# Patient Record
Sex: Male | Born: 1980 | Race: White | Hispanic: No | Marital: Married | Smoking: Never smoker
Health system: Southern US, Community
[De-identification: ages and names within clinical notes are randomized; demographics above are authoritative.]

## PROBLEM LIST (undated history)

## (undated) DIAGNOSIS — Z8619 Personal history of other infectious and parasitic diseases: Secondary | ICD-10-CM

## (undated) DIAGNOSIS — R7989 Other specified abnormal findings of blood chemistry: Secondary | ICD-10-CM

## (undated) HISTORY — DX: Personal history of other infectious and parasitic diseases: Z86.19

## (undated) HISTORY — PX: WISDOM TOOTH EXTRACTION: SHX21

---

## 2011-08-29 ENCOUNTER — Encounter: Payer: Self-pay | Admitting: *Deleted

## 2011-08-29 ENCOUNTER — Emergency Department (HOSPITAL_BASED_OUTPATIENT_CLINIC_OR_DEPARTMENT_OTHER)
Admission: EM | Admit: 2011-08-29 | Discharge: 2011-08-29 | Disposition: A | Discharge status: Home / Self Care | Payer: 59 | Attending: Emergency Medicine | Admitting: Emergency Medicine

## 2011-08-29 DIAGNOSIS — S91009A Unspecified open wound, unspecified ankle, initial encounter: Secondary | ICD-10-CM | POA: Insufficient documentation

## 2011-08-29 DIAGNOSIS — W268XXA Contact with other sharp object(s), not elsewhere classified, initial encounter: Secondary | ICD-10-CM | POA: Insufficient documentation

## 2011-08-29 DIAGNOSIS — S81009A Unspecified open wound, unspecified knee, initial encounter: Secondary | ICD-10-CM | POA: Insufficient documentation

## 2011-08-29 DIAGNOSIS — Y9289 Other specified places as the place of occurrence of the external cause: Secondary | ICD-10-CM | POA: Insufficient documentation

## 2011-08-29 DIAGNOSIS — S81819A Laceration without foreign body, unspecified lower leg, initial encounter: Secondary | ICD-10-CM

## 2011-08-29 HISTORY — DX: Other specified abnormal findings of blood chemistry: R79.89

## 2011-08-29 MED ORDER — TETANUS-DIPHTH-ACELL PERTUSSIS 5-2.5-18.5 LF-MCG/0.5 IM SUSP
0.5000 mL | Freq: Once | INTRAMUSCULAR | Status: AC
  Administered 2011-08-29: 0.5 mL via INTRAMUSCULAR
  Filled 2011-08-29: qty 0.5

## 2011-08-29 MED ORDER — LIDOCAINE HCL 2 % IJ SOLN
INTRAMUSCULAR | Status: AC
  Administered 2011-08-29: 20 mg
  Filled 2011-08-29: qty 1

## 2011-08-29 NOTE — ED Notes (Signed)
Pt being sutured

## 2011-08-29 NOTE — ED Provider Notes (Signed)
History     CSN: [id number] Arrival date & time: 08/29/2011  6:38 PM   First MD Initiated Contact with Patient 08/29/11 1934      Chief Complaint  Patient presents with  . Laceration    (Consider location/radiation/quality/duration/timing/severity/associated sxs/prior treatment) Patient is a 30 y.o. male presenting with skin laceration. The history is provided by the patient. No language interpreter was used.  Laceration  The incident occurred 1 to 2 hours ago. The laceration is located on the right leg. The laceration is 2 cm in size. The laceration mechanism was a a metal edge. The pain is at a severity of 3/10. The pain is mild. The pain has been constant since onset. He reports no foreign bodies present. His tetanus status is out of date.  Pt cut leg on work out equipment  Past Medical History  Diagnosis Date  . Elevated platelet count     History reviewed. No pertinent past surgical history.  History reviewed. No pertinent family history.  History  Substance Use Topics  . Smoking status: Never Smoker   . Smokeless tobacco: Not on file  . Alcohol Use: Yes      Review of Systems  All other systems reviewed and are negative.    Allergies  Review of patient's allergies indicates no known allergies.  Home Medications  No current outpatient prescriptions on file.  BP 150/98  Pulse 81  Temp(Src) 97.9 F (36.6 C) (Oral)  Resp 18  Ht 6\' 7"  (2.007 m)  Wt 252 lb (114.306 kg)  BMI 28.39 kg/m2  SpO2 100%  Physical Exam  Vitals reviewed. Constitutional: He is oriented to person, place, and time. He appears well-developed and well-nourished.  HENT:  Head: Normocephalic.  Musculoskeletal:       2cm laceration right shin gapping  Neurological: He is alert and oriented to person, place, and time.  Skin: Skin is warm.  Psychiatric: He has a normal mood and affect.    ED Course  LACERATION REPAIR Date/Time: 08/29/2011 8:15 PM Performed by: Langston Masker Authorized by: Langston Masker Consent: Verbal consent not obtained. Consent given by: patient Patient understanding: patient states understanding of the procedure being performed Patient identity confirmed: verbally with patient Time out: Immediately prior to procedure a "time out" was called to verify the correct patient, procedure, equipment, support staff and site/side marked as required. Body area: lower extremity Location details: right lower leg Laceration length: 2 cm Foreign bodies: no foreign bodies Tendon involvement: none Nerve involvement: none Vascular damage: no Anesthesia: local infiltration Local anesthetic: lidocaine 2% without epinephrine Irrigation solution: saline Irrigation method: syringe Amount of cleaning: standard Debridement: none Degree of undermining: none Skin closure: 4-0 Prolene Number of sutures: 6 Technique: simple Approximation: loose Patient tolerance: Patient tolerated the procedure well with no immediate complications.   (including critical care time)  Labs Reviewed - No data to display No results found.   No diagnosis found.    MDM          Langston Masker, [place] 08/29/11 2017

## 2011-08-29 NOTE — ED Provider Notes (Signed)
Evaluation and management procedures were performed by the PA/NP under my supervision/collaboration.    [employer] D Kitai Purdom, MD 08/29/11 [id number] 

## 2011-08-29 NOTE — ED Notes (Signed)
Pt states he was working out in the gym and hit his right shin on some equipment. Approx 3-4 cm lac to area. Dressing applied and bleeding controlled.

## 2015-08-11 ENCOUNTER — Telehealth: Payer: Self-pay

## 2015-08-11 NOTE — Telephone Encounter (Signed)
Called patient to verify/update chart information prior to appointment tomorrow. Left message for call back.

## 2015-08-12 ENCOUNTER — Encounter: Payer: Self-pay | Admitting: Physician Assistant

## 2015-08-12 ENCOUNTER — Ambulatory Visit (INDEPENDENT_AMBULATORY_CARE_PROVIDER_SITE_OTHER): Payer: 59 | Admitting: Physician Assistant

## 2015-08-12 VITALS — BP 138/82 | HR 68 | Temp 97.8°F | Resp 16 | Ht 78.0 in | Wt 253.4 lb

## 2015-08-12 DIAGNOSIS — B351 Tinea unguium: Secondary | ICD-10-CM | POA: Diagnosis not present

## 2015-08-12 DIAGNOSIS — Z0000 Encounter for general adult medical examination without abnormal findings: Secondary | ICD-10-CM | POA: Diagnosis not present

## 2015-08-12 DIAGNOSIS — D473 Essential (hemorrhagic) thrombocythemia: Secondary | ICD-10-CM

## 2015-08-12 DIAGNOSIS — D75839 Thrombocytosis, unspecified: Secondary | ICD-10-CM | POA: Insufficient documentation

## 2015-08-12 NOTE — Assessment & Plan Note (Signed)
Followed by Hematology. Will obtain CBC.

## 2015-08-12 NOTE — Assessment & Plan Note (Signed)
Will obtain LFTS. If normal will begin oral terbinafine.

## 2015-08-12 NOTE — Patient Instructions (Signed)
Please go to the lab for blood work.  I will call you with your results. If your blood work is normal we will follow-up yearly for physicals.    As long as liver testing looks good we will start oral terbinafine for the nail infection.  Please schedule an appointment for your fasting labs.  Preventive Care for Adults, Male A healthy lifestyle and preventive care can promote health and wellness. Preventive health guidelines for men include the following key practices:  A routine yearly physical is a good way to check with your health care provider about your health and preventative screening. It is a chance to share any concerns and updates on your health and to receive a thorough exam.  Visit your dentist for a routine exam and preventative care every 6 months. Brush your teeth twice a day and floss once a day. Good oral hygiene prevents tooth decay and gum disease.  The frequency of eye exams is based on your age, health, family medical history, use of contact lenses, and other factors. Follow your health care provider's recommendations for frequency of eye exams.  Eat a healthy diet. Foods such as vegetables, fruits, whole grains, low-fat dairy products, and lean protein foods contain the nutrients you need without too many calories. Decrease your intake of foods high in solid fats, added sugars, and salt. Eat the right amount of calories for you.Get information about a proper diet from your health care provider, if necessary.  Regular physical exercise is one of the most important things you can do for your health. Most adults should get at least 150 minutes of moderate-intensity exercise (any activity that increases your heart rate and causes you to sweat) each week. In addition, most adults need muscle-strengthening exercises on 2 or more days a week.  Maintain a healthy weight. The body mass index (BMI) is a screening tool to identify possible weight problems. It provides an estimate of  body fat based on height and weight. Your health care provider can find your BMI and can help you achieve or maintain a healthy weight.For adults 20 years and older:  A BMI below 18.5 is considered underweight.  A BMI of 18.5 to 24.9 is normal.  A BMI of 25 to 29.9 is considered overweight.  A BMI of 30 and above is considered obese.  Maintain normal blood lipids and cholesterol levels by exercising and minimizing your intake of saturated fat. Eat a balanced diet with plenty of fruit and vegetables. Blood tests for lipids and cholesterol should begin at age 47 and be repeated every 5 years. If your lipid or cholesterol levels are high, you are over 50, or you are at high risk for heart disease, you may need your cholesterol levels checked more frequently.Ongoing high lipid and cholesterol levels should be treated with medicines if diet and exercise are not working.  If you smoke, find out from your health care provider how to quit. If you do not use tobacco, do not start.  Lung cancer screening is recommended for adults aged 79-80 years who are at high risk for developing lung cancer because of a history of smoking. A yearly low-dose CT scan of the lungs is recommended for people who have at least a 30-pack-year history of smoking and are a current smoker or have quit within the past 15 years. A pack year of smoking is smoking an average of 1 pack of cigarettes a day for 1 year (for example: 1 pack a day for  30 years or 2 packs a day for 15 years). Yearly screening should continue until the smoker has stopped smoking for at least 15 years. Yearly screening should be stopped for people who develop a health problem that would prevent them from having lung cancer treatment.  If you choose to drink alcohol, do not have more than 2 drinks per day. One drink is considered to be 12 ounces (355 mL) of beer, 5 ounces (148 mL) of wine, or 1.5 ounces (44 mL) of liquor.  Avoid use of street drugs. Do not  share needles with anyone. Ask for help if you need support or instructions about stopping the use of drugs.  High blood pressure causes heart disease and increases the risk of stroke. Your blood pressure should be checked at least every 1-2 years. Ongoing high blood pressure should be treated with medicines, if weight loss and exercise are not effective.  If you are 22-77 years old, ask your health care provider if you should take aspirin to prevent heart disease.  Diabetes screening is done by taking a blood sample to check your blood glucose level after you have not eaten for a certain period of time (fasting). If you are not overweight and you do not have risk factors for diabetes, you should be screened once every 3 years starting at age 17. If you are overweight or obese and you are 30-8 years of age, you should be screened for diabetes every year as part of your cardiovascular risk assessment.  Colorectal cancer can be detected and often prevented. Most routine colorectal cancer screening begins at the age of 34 and continues through age 5. However, your health care provider may recommend screening at an earlier age if you have risk factors for colon cancer. On a yearly basis, your health care provider may provide home test kits to check for hidden blood in the stool. Use of a small camera at the end of a tube to directly examine the colon (sigmoidoscopy or colonoscopy) can detect the earliest forms of colorectal cancer. Talk to your health care provider about this at age 63, when routine screening begins. Direct exam of the colon should be repeated every 5-10 years through age 55, unless early forms of precancerous polyps or small growths are found.  People who are at an increased risk for hepatitis B should be screened for this virus. You are considered at high risk for hepatitis B if:  You were born in a country where hepatitis B occurs often. Talk with your health care provider about which  countries are considered high risk.  Your parents were born in a high-risk country and you have not received a shot to protect against hepatitis B (hepatitis B vaccine).  You have HIV or AIDS.  You use needles to inject street drugs.  You live with, or have sex with, someone who has hepatitis B.  You are a man who has sex with other men (MSM).  You get hemodialysis treatment.  You take certain medicines for conditions such as cancer, organ transplantation, and autoimmune conditions.  Hepatitis C blood testing is recommended for all people born from [birth year] through 1965 and any individual with known risks for hepatitis C.  Practice safe sex. Use condoms and avoid high-risk sexual practices to reduce the spread of sexually transmitted infections (STIs). STIs include gonorrhea, chlamydia, syphilis, trichomonas, herpes, HPV, and human immunodeficiency virus (HIV). Herpes, HIV, and HPV are viral illnesses that have no cure. They can result in  disability, cancer, and death.  If you are a man who has sex with other men, you should be screened at least once per year for:  HIV.  Urethral, rectal, and pharyngeal infection of gonorrhea, chlamydia, or both.  If you are at risk of being infected with HIV, it is recommended that you take a prescription medicine daily to prevent HIV infection. This is called preexposure prophylaxis (PrEP). You are considered at risk if:  You are a man who has sex with other men (MSM) and have other risk factors.  You are a heterosexual man, are sexually active, and are at increased risk for HIV infection.  You take drugs by injection.  You are sexually active with a partner who has HIV.  Talk with your health care provider about whether you are at high risk of being infected with HIV. If you choose to begin PrEP, you should first be tested for HIV. You should then be tested every 3 months for as long as you are taking PrEP.  A one-time screening for abdominal  aortic aneurysm (AAA) and surgical repair of large AAAs by ultrasound are recommended for men ages 46 to 81 years who are current or former smokers.  Healthy men should no longer receive prostate-specific antigen (PSA) blood tests as part of routine cancer screening. Talk with your health care provider about prostate cancer screening.  Testicular cancer screening is not recommended for adult males who have no symptoms. Screening includes self-exam, a health care provider exam, and other screening tests. Consult with your health care provider about any symptoms you have or any concerns you have about testicular cancer.  Use sunscreen. Apply sunscreen liberally and repeatedly throughout the day. You should seek shade when your shadow is shorter than you. Protect yourself by wearing long sleeves, pants, a wide-brimmed hat, and sunglasses year round, whenever you are outdoors.  Once a month, do a whole-body skin exam, using a mirror to look at the skin on your back. Tell your health care provider about new moles, moles that have irregular borders, moles that are larger than a pencil eraser, or moles that have changed in shape or color.  Stay current with required vaccines (immunizations).  Influenza vaccine. All adults should be immunized every year.  Tetanus, diphtheria, and acellular pertussis (Td, Tdap) vaccine. An adult who has not previously received Tdap or who does not know his vaccine status should receive 1 dose of Tdap. This initial dose should be followed by tetanus and diphtheria toxoids (Td) booster doses every 10 years. Adults with an unknown or incomplete history of completing a 3-dose immunization series with Td-containing vaccines should begin or complete a primary immunization series including a Tdap dose. Adults should receive a Td booster every 10 years.  Varicella vaccine. An adult without evidence of immunity to varicella should receive 2 doses or a second dose if he has previously  received 1 dose.  Human papillomavirus (HPV) vaccine. Males aged 11-21 years who have not received the vaccine previously should receive the 3-dose series. Males aged 22-26 years may be immunized. Immunization is recommended through the age of 65 years for any male who has sex with males and did not get any or all doses earlier. Immunization is recommended for any person with an immunocompromised condition through the age of 54 years if he did not get any or all doses earlier. During the 3-dose series, the second dose should be obtained 4-8 weeks after the first dose. The third dose should  be obtained 24 weeks after the first dose and 16 weeks after the second dose.  Zoster vaccine. One dose is recommended for adults aged 64 years or older unless certain conditions are present.  Measles, mumps, and rubella (MMR) vaccine. Adults born before [birth year] generally are considered immune to measles and mumps. Adults born in [birth year] or later should have 1 or more doses of MMR vaccine unless there is a contraindication to the vaccine or there is laboratory evidence of immunity to each of the three diseases. A routine second dose of MMR vaccine should be obtained at least 28 days after the first dose for students attending postsecondary schools, health care workers, or international travelers. People who received inactivated measles vaccine or an unknown type of measles vaccine during 1963-1967 should receive 2 doses of MMR vaccine. People who received inactivated mumps vaccine or an unknown type of mumps vaccine before 1979 and are at high risk for mumps infection should consider immunization with 2 doses of MMR vaccine. Unvaccinated health care workers born before [birth year] who lack laboratory evidence of measles, mumps, or rubella immunity or laboratory confirmation of disease should consider measles and mumps immunization with 2 doses of MMR vaccine or rubella immunization with 1 dose of MMR vaccine.  Pneumococcal 13-valent  conjugate (PCV13) vaccine. When indicated, a person who is uncertain of his immunization history and has no record of immunization should receive the PCV13 vaccine. All adults 91 years of age and older should receive this vaccine. An adult aged 86 years or older who has certain medical conditions and has not been previously immunized should receive 1 dose of PCV13 vaccine. This PCV13 should be followed with a dose of pneumococcal polysaccharide (PPSV23) vaccine. Adults who are at high risk for pneumococcal disease should obtain the PPSV23 vaccine at least 8 weeks after the dose of PCV13 vaccine. Adults older than 36 years of age who have normal immune system function should obtain the PPSV23 vaccine dose at least 1 year after the dose of PCV13 vaccine.  Pneumococcal polysaccharide (PPSV23) vaccine. When PCV13 is also indicated, PCV13 should be obtained first. All adults aged 2 years and older should be immunized. An adult younger than age 49 years who has certain medical conditions should be immunized. Any person who resides in a nursing home or long-term care facility should be immunized. An adult smoker should be immunized. People with an immunocompromised condition and certain other conditions should receive both PCV13 and PPSV23 vaccines. People with human immunodeficiency virus (HIV) infection should be immunized as soon as possible after diagnosis. Immunization during chemotherapy or radiation therapy should be avoided. Routine use of PPSV23 vaccine is not recommended for American Indians, [place] Natives, or people younger than 65 years unless there are medical conditions that require PPSV23 vaccine. When indicated, people who have unknown immunization and have no record of immunization should receive PPSV23 vaccine. One-time revaccination 5 years after the first dose of PPSV23 is recommended for people aged 19-64 years who have chronic kidney failure, nephrotic syndrome, asplenia, or immunocompromised  conditions. People who received 1-2 doses of PPSV23 before age 79 years should receive another dose of PPSV23 vaccine at age 62 years or later if at least 5 years have passed since the previous dose. Doses of PPSV23 are not needed for people immunized with PPSV23 at or after age 58 years.  Meningococcal vaccine. Adults with asplenia or persistent complement component deficiencies should receive 2 doses of quadrivalent meningococcal conjugate (MenACWY-D) vaccine. The doses should be  obtained at least 2 months apart. Microbiologists working with certain meningococcal bacteria, [place] recruits, people at risk during an outbreak, and people who travel to or live in countries with a high rate of meningitis should be immunized. A first-year college student up through age 42 years who is living in a residence hall should receive a dose if he did not receive a dose on or after his 16th birthday. Adults who have certain high-risk conditions should receive one or more doses of vaccine.  Hepatitis A vaccine. Adults who wish to be protected from this disease, have chronic liver disease, work with hepatitis A-infected animals, work in hepatitis A research labs, or travel to or work in countries with a high rate of hepatitis A should be immunized. Adults who were previously unvaccinated and who anticipate close contact with an international adoptee during the first 60 days after arrival in the [place] from a country with a high rate of hepatitis A should be immunized.  Hepatitis B vaccine. Adults should be immunized if they wish to be protected from this disease, are under age 91 years and have diabetes, have chronic liver disease, have had more than one sex partner in the past 6 months, may be exposed to blood or other infectious body fluids, are household contacts or sex partners of hepatitis B positive people, are clients or workers in certain care facilities, or travel to or work in countries with a high rate of  hepatitis B.  Haemophilus influenzae type b (Hib) vaccine. A previously unvaccinated person with asplenia or sickle cell disease or having a scheduled splenectomy should receive 1 dose of Hib vaccine. Regardless of previous immunization, a recipient of a hematopoietic stem cell transplant should receive a 3-dose series 6-12 months after his successful transplant. Hib vaccine is not recommended for adults with HIV infection. Preventive Service / Frequency Ages 44 to 33  Blood pressure check.** / Every 3-5 years.  Lipid and cholesterol check.** / Every 5 years beginning at age 88.  Hepatitis C blood test.** / For any individual with known risks for hepatitis C.  Skin self-exam. / Monthly.  Influenza vaccine. / Every year.  Tetanus, diphtheria, and acellular pertussis (Tdap, Td) vaccine.** / Consult your health care provider. 1 dose of Td every 10 years.  Varicella vaccine.** / Consult your health care provider.  HPV vaccine. / 3 doses over 6 months, if 82 or younger.  Measles, mumps, rubella (MMR) vaccine.** / You need at least 1 dose of MMR if you were born in 1957 or later. You may also need a second dose.  Pneumococcal 13-valent conjugate (PCV13) vaccine.** / Consult your health care provider.  Pneumococcal polysaccharide (PPSV23) vaccine.** / 1 to 2 doses if you smoke cigarettes or if you have certain conditions.  Meningococcal vaccine.** / 1 dose if you are age 37 to 79 years and a [employer] researcher living in a residence hall, or have one of several medical conditions. You may also need additional booster doses.  Hepatitis A vaccine.** / Consult your health care provider.  Hepatitis B vaccine.** / Consult your health care provider.  Haemophilus influenzae type b (Hib) vaccine.** / Consult your health care provider. Ages 59 to 74  Blood pressure check.** / Every year.  Lipid and cholesterol check.** / Every 5 years beginning at age 52.  Lung cancer screening. /  Every year if you are aged 93-80 years and have a 30-pack-year history of smoking and currently smoke or have quit within the  past 15 years. Yearly screening is stopped once you have quit smoking for at least 15 years or develop a health problem that would prevent you from having lung cancer treatment.  Fecal occult blood test (FOBT) of stool. / Every year beginning at age 1 and continuing until age 6. You may not have to do this test if you get a colonoscopy every 10 years.  Flexible sigmoidoscopy** or colonoscopy.** / Every 5 years for a flexible sigmoidoscopy or every 10 years for a colonoscopy beginning at age 51 and continuing until age 69.  Hepatitis C blood test.** / For all people born from [birth year] through 1965 and any individual with known risks for hepatitis C.  Skin self-exam. / Monthly.  Influenza vaccine. / Every year.  Tetanus, diphtheria, and acellular pertussis (Tdap/Td) vaccine.** / Consult your health care provider. 1 dose of Td every 10 years.  Varicella vaccine.** / Consult your health care provider.  Zoster vaccine.** / 1 dose for adults aged 52 years or older.  Measles, mumps, rubella (MMR) vaccine.** / You need at least 1 dose of MMR if you were born in 1959 or later. You may also need a second dose.  Pneumococcal 13-valent conjugate (PCV13) vaccine.** / Consult your health care provider.  Pneumococcal polysaccharide (PPSV23) vaccine.** / 1 to 2 doses if you smoke cigarettes or if you have certain conditions.  Meningococcal vaccine.** / Consult your health care provider.  Hepatitis A vaccine.** / Consult your health care provider.  Hepatitis B vaccine.** / Consult your health care provider.  Haemophilus influenzae type b (Hib) vaccine.** / Consult your health care provider. Ages 33 and over  Blood pressure check.** / Every year.  Lipid and cholesterol check.**/ Every 5 years beginning at age 69.  Lung cancer screening. / Every year if you are aged 56-80  years and have a 30-pack-year history of smoking and currently smoke or have quit within the past 15 years. Yearly screening is stopped once you have quit smoking for at least 15 years or develop a health problem that would prevent you from having lung cancer treatment.  Fecal occult blood test (FOBT) of stool. / Every year beginning at age 31 and continuing until age 80. You may not have to do this test if you get a colonoscopy every 10 years.  Flexible sigmoidoscopy** or colonoscopy.** / Every 5 years for a flexible sigmoidoscopy or every 10 years for a colonoscopy beginning at age 28 and continuing until age 16.  Hepatitis C blood test.** / For all people born from [birth year] through 1967 and any individual with known risks for hepatitis C.  Abdominal aortic aneurysm (AAA) screening.** / A one-time screening for ages 70 to 41 years who are current or former smokers.  Skin self-exam. / Monthly.  Influenza vaccine. / Every year.  Tetanus, diphtheria, and acellular pertussis (Tdap/Td) vaccine.** / 1 dose of Td every 10 years.  Varicella vaccine.** / Consult your health care provider.  Zoster vaccine.** / 1 dose for adults aged 28 years or older.  Pneumococcal 13-valent conjugate (PCV13) vaccine.** / 1 dose for all adults aged 36 years and older.  Pneumococcal polysaccharide (PPSV23) vaccine.** / 1 dose for all adults aged 51 years and older.  Meningococcal vaccine.** / Consult your health care provider.  Hepatitis A vaccine.** / Consult your health care provider.  Hepatitis B vaccine.** / Consult your health care provider.  Haemophilus influenzae type b (Hib) vaccine.** / Consult your health care provider. **Family history and personal history of  risk and conditions may change your health care provider's recommendations.   This information is not intended to replace advice given to you by your health care provider. Make sure you discuss any questions you have with your health care  provider.   Document Released: 11/02/2001 Document Revised: 09/27/2014 Document Reviewed: 02/01/2011 [employer]vier Interactive Patient Education [employer].

## 2015-08-12 NOTE — Progress Notes (Signed)
Patient presents to clinic today to establish care and for annual exam.    Chronic Issues: Onychomycosis -- left great toe. Has been thick and yellowish/green over the past year. Endorses treated successfully some years ago with weekly diflucan.  Thrombocytosis -- long-standing history. Is followed by [facility] hematology. Negative workup. States platelets average 450,000-500,000.  Health Maintenance: Dental -- up-to-date Vision -- up-to-date Immunizations -- Flu shot up-to-date. Tetanus up-to-date  Past Medical History  Diagnosis Date  . Elevated platelet count ([place])   . History of chicken pox     Past Surgical History  Procedure Laterality Date  . Wisdom tooth extraction      No current outpatient prescriptions on file prior to visit.   No current facility-administered medications on file prior to visit.    No Known Allergies  Family History  Problem Relation Age of Onset  . Healthy Father   . Healthy Mother   . Colon cancer Paternal Grandmother 54  . Heart attack Maternal Grandfather   . Healthy Brother     x1  . Healthy Daughter     x4    Social History   Social History  . Marital Status: Married    Spouse Name: N/A  . Number of Children: N/A  . Years of Education: N/A   Occupational History  . Pharmacist    Social History Main Topics  . Smoking status: Never Smoker   . Smokeless tobacco: Never Used  . Alcohol Use: 0.0 oz/week    0 Standard drinks or equivalent per week     Comment: social -- 1 per month  . Drug Use: No  . Sexual Activity:    Partners: Female   Other Topics Concern  . Not on file   Social History Narrative   Review of Systems  Constitutional: Negative for fever and weight loss.  HENT: Negative for ear discharge, ear pain, hearing loss and tinnitus.   Eyes: Negative for blurred vision, double vision, photophobia and pain.  Respiratory: Negative for cough and shortness of breath.   Cardiovascular: Negative for chest  pain and palpitations.  Gastrointestinal: Negative for heartburn, nausea, vomiting, abdominal pain, diarrhea, constipation, blood in stool and melena.  Genitourinary: Negative for dysuria, urgency, frequency, hematuria and flank pain.  Musculoskeletal: Negative for falls.  Neurological: Negative for dizziness, loss of consciousness and headaches.  Endo/Heme/Allergies: Negative for environmental allergies.  Psychiatric/Behavioral: Negative for depression, suicidal ideas, hallucinations and substance abuse. The patient is not nervous/anxious and does not have insomnia.    BP 138/82 mmHg  Pulse 68  Temp(Src) 97.8 F (36.6 C) (Oral)  Resp 16  Ht 6\' 6"  (1.981 m)  Wt 253 lb 6 oz (114.93 kg)  BMI 29.29 kg/m2  SpO2 100%  Physical Exam  Constitutional: He is oriented to person, place, and time and well-developed, well-nourished, and in no distress.  HENT:  Head: Normocephalic and atraumatic.  Right Ear: External ear normal.  Left Ear: External ear normal.  Nose: Nose normal.  Mouth/Throat: Oropharynx is clear and moist. No oropharyngeal exudate.  Eyes: Conjunctivae and EOM are normal. Pupils are equal, round, and reactive to light.  Neck: Neck supple. No thyromegaly present.  Cardiovascular: Normal rate, regular rhythm, normal heart sounds and intact distal pulses.   Pulmonary/Chest: Effort normal and breath sounds normal. No respiratory distress. He has no wheezes. He has no rales. He exhibits no tenderness.  Abdominal: Soft. Bowel sounds are normal. He exhibits no distension and no mass. There is no  tenderness. There is no rebound and no guarding.  Genitourinary: Testes/scrotum normal and penis normal. No discharge found.  Lymphadenopathy:    He has no cervical adenopathy.  Neurological: He is alert and oriented to person, place, and time.  Skin: Skin is warm and dry. No rash noted.  Onychomycosis noted on left great toe  Psychiatric: Affect normal.  Vitals reviewed.  No results  found for this or any previous visit (from the past 2160 hour(s)).  Assessment/Plan: Onychomycosis Will obtain LFTS. If normal will begin oral terbinafine.  Thrombocytosis ([place]) Followed by Hematology. Will obtain CBC.  Visit for preventive health examination Depression screen negative. Health Maintenance reviewed -- Flu and Tetanus up-to-date. Preventive schedule discussed and handout given in AVS. Patient to return for fasting labs. Orders placed - CBC, CMP, A1C, Lipid Profile, UA.

## 2015-08-12 NOTE — Assessment & Plan Note (Signed)
Depression screen negative. Health Maintenance reviewed -- Flu and Tetanus up-to-date. Preventive schedule discussed and handout given in AVS. Patient to return for fasting labs. Orders placed - CBC, CMP, A1C, Lipid Profile, UA.

## 2015-08-13 ENCOUNTER — Other Ambulatory Visit (INDEPENDENT_AMBULATORY_CARE_PROVIDER_SITE_OTHER): Payer: 59

## 2015-08-13 DIAGNOSIS — D473 Essential (hemorrhagic) thrombocythemia: Secondary | ICD-10-CM | POA: Diagnosis not present

## 2015-08-13 LAB — COMPREHENSIVE METABOLIC PANEL
ALK PHOS: 54 U/L (ref 39–117)
ALT: 19 U/L (ref 0–53)
AST: 21 U/L (ref 0–37)
Albumin: 4.2 g/dL (ref 3.5–5.2)
BILIRUBIN TOTAL: 0.4 mg/dL (ref 0.2–1.2)
BUN: 15 mg/dL (ref 6–23)
CO2: 28 mEq/L (ref 19–32)
CREATININE: 0.98 mg/dL (ref 0.40–1.50)
Calcium: 9.7 mg/dL (ref 8.4–10.5)
Chloride: 107 mEq/L (ref 96–112)
GFR: 91.83 mL/min (ref >60.00)
GLUCOSE: 110 mg/dL — AB (ref 70–99)
POTASSIUM: 4.4 meq/L (ref 3.5–5.1)
SODIUM: 141 meq/L (ref 135–145)
TOTAL PROTEIN: 7.3 g/dL (ref 6.0–8.3)

## 2015-08-13 LAB — LIPID PANEL
CHOLESTEROL: 177 mg/dL (ref 0–200)
HDL: 46.9 mg/dL (ref >39.00)
LDL CALC: 119 mg/dL — AB (ref 0–99)
NonHDL: 130.36
Total CHOL/HDL Ratio: 4
Triglycerides: 55 mg/dL (ref 0.0–149.0)
VLDL: 11 mg/dL (ref 0.0–40.0)

## 2015-08-13 LAB — URINALYSIS, ROUTINE W REFLEX MICROSCOPIC
Bilirubin Urine: NEGATIVE
Hgb urine dipstick: NEGATIVE
Ketones, ur: NEGATIVE
Leukocytes, UA: NEGATIVE
NITRITE: NEGATIVE
PH: 5.5 (ref 5.0–8.0)
RBC / HPF: NONE SEEN (ref 0–?)
SPECIFIC GRAVITY, URINE: 1.02 (ref 1.000–1.030)
Total Protein, Urine: NEGATIVE
URINE GLUCOSE: NEGATIVE
Urobilinogen, UA: 0.2 (ref 0.0–1.0)
WBC, UA: NONE SEEN (ref 0–?)

## 2015-08-13 LAB — CBC
HCT: 41.2 % (ref 39.0–52.0)
Hemoglobin: 13.6 g/dL (ref 13.0–17.0)
MCHC: 33.1 g/dL (ref 30.0–36.0)
MCV: 85.1 fl (ref 78.0–100.0)
Platelets: 354 10*3/uL (ref 150.0–400.0)
RBC: 4.84 Mil/uL (ref 4.22–5.81)
RDW: 13.7 % (ref 11.5–15.5)
WBC: 7.2 10*3/uL (ref 4.0–10.5)

## 2015-08-13 LAB — HEMOGLOBIN A1C: HEMOGLOBIN A1C: 6.1 % (ref 4.6–6.5)

## 2015-08-21 ENCOUNTER — Other Ambulatory Visit: Payer: Self-pay

## 2015-08-21 MED ORDER — TERBINAFINE HCL 250 MG PO TABS: 250.0000 mg | ORAL_TABLET | Freq: Every day | ORAL | Status: DC

## 2015-10-01 MED FILL — TERBINAFINE HCL 250 MG TAB: 250 | 30 days supply | Qty: 30 | Fill #1

## 2015-12-01 ENCOUNTER — Ambulatory Visit: Payer: Self-pay

## 2015-12-01 ENCOUNTER — Encounter: Payer: Self-pay | Admitting: Podiatry

## 2015-12-01 ENCOUNTER — Ambulatory Visit (INDEPENDENT_AMBULATORY_CARE_PROVIDER_SITE_OTHER): Payer: 59 | Admitting: Podiatry

## 2015-12-01 ENCOUNTER — Ambulatory Visit (INDEPENDENT_AMBULATORY_CARE_PROVIDER_SITE_OTHER): Payer: 59

## 2015-12-01 VITALS — BP 148/89 | HR 72 | Resp 16 | Ht 79.0 in | Wt 240.0 lb

## 2015-12-01 DIAGNOSIS — M779 Enthesopathy, unspecified: Secondary | ICD-10-CM

## 2015-12-01 DIAGNOSIS — M21619 Bunion of unspecified foot: Secondary | ICD-10-CM

## 2015-12-01 NOTE — Progress Notes (Signed)
   Subjective:    Patient ID: Keith Wright, male    DOB: 09/24/1980, 35 y.o.   MRN: [id number]  HPI Patient presents with bilateral foot pain; bunions. Pt stated, "left foot hurts more than right".  Review of Systems  All other systems reviewed and are negative.      Objective:   Physical Exam        Assessment & Plan:

## 2015-12-01 NOTE — Patient Instructions (Signed)
Pre-Operative Instructions  Congratulations, you have decided to take an important step to improving your quality of life.  You can be assured that the doctors of [facility] will be with you every step of the way.  1. Plan to be at the surgery center/hospital at least 1 (one) hour prior to your scheduled time unless otherwise directed by the surgical center/hospital staff.  You must have a responsible adult accompany you, remain during the surgery and drive you home.  Make sure you have directions to the surgical center/hospital and know how to get there on time. 2. For hospital based surgery you will need to obtain a history and physical form from your family physician within 1 month prior to the date of surgery- we will give you a form for you primary physician.  3. We make every effort to accommodate the date you request for surgery.  There are however, times where surgery dates or times have to be moved.  We will contact you as soon as possible if a change in schedule is required.   4. No Aspirin/Ibuprofen for one week before surgery.  If you are on aspirin, any non-steroidal anti-inflammatory medications (Mobic, Aleve, Ibuprofen) you should stop taking it 7 days prior to your surgery.  You make take Tylenol  For pain prior to surgery.  5. Medications- If you are taking daily heart and blood pressure medications, seizure, reflux, allergy, asthma, anxiety, pain or diabetes medications, make sure the surgery center/hospital is aware before the day of surgery so they may notify you which medications to take or avoid the day of surgery. 6. No food or drink after midnight the night before surgery unless directed otherwise by surgical center/hospital staff. 7. No alcoholic beverages 24 hours prior to surgery.  No smoking 24 hours prior to or 24 hours after surgery. 8. Wear loose pants or shorts- loose enough to fit over bandages, boots, and casts. 9. No slip on shoes, sneakers are best. 10. Bring  your boot with you to the surgery center/hospital.  Also bring crutches or a walker if your physician has prescribed it for you.  If you do not have this equipment, it will be provided for you after surgery. 11. If you have not been contracted by the surgery center/hospital by the day before your surgery, call to confirm the date and time of your surgery. 12. Leave-time from work may vary depending on the type of surgery you have.  Appropriate arrangements should be made prior to surgery with your employer. 13. Prescriptions will be provided immediately following surgery by your doctor.  Have these filled as soon as possible after surgery and take the medication as directed. 14. Remove nail polish on the operative foot. 15. Wash the night before surgery.  The night before surgery wash the foot and leg well with the antibacterial soap provided and water paying special attention to beneath the toenails and in between the toes.  Rinse thoroughly with water and dry well with a towel.  Perform this wash unless told not to do so by your physician.  Enclosed: 1 Ice pack (please put in freezer the night before surgery)   1 Hibiclens skin cleaner   Pre-op Instructions  If you have any questions regarding the instructions, do not hesitate to call our office.  [place]: [address] [place], [place] [address] [phone]  [place]: [address] [place], [place] [address] [phone]  [place]: [address]  [place], [place] [address] [phone]  Dr. Richard   Tuchman DPM, Dr. Norman Regal DPM Dr. Richard Sikora DPM, Dr. M. Todd Hyatt DPM, Dr. Kathryn Egerton DPM 

## 2015-12-01 NOTE — Progress Notes (Signed)
Subjective:     Patient ID: Keith Wright, male   DOB: [birth year], 35 y.o.   MRN: [id number]  HPI patient states I've had a lot of pain in my left over right bunion for several years and I've tried shoe gear modifications I've tried soaks and anti-inflammatories without relief and I do have a significant history of this problem with my mother having had previous surgery   Review of Systems  All other systems reviewed and are negative.      Objective:   Physical Exam  Constitutional: He is oriented to person, place, and time.  Cardiovascular: Intact distal pulses.   Musculoskeletal: Normal range of motion.  Neurological: He is oriented to person, place, and time.  Skin: Skin is warm.  Nursing note and vitals reviewed.  Neurovascular status found to be intact with muscle strength adequate range of motion within normal limits. Patient's found to have good digital perfusion is well oriented 3 with moderate deviation of the hallux against the second toe bilateral and redness and pain around the first metatarsal head left over right with prominence the bone structure. 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 At this point I discussed structure and I've recommended Austin-type osteotomy with consideration for Akin osteotomy and I reviewed with patient. Patient wants to do consent form now is he does not want have to take off work again and I allowed him to read a consent form reviewing alternative treatments and complications associated with this procedure and all risk as outlined. He understands all this and understanding recovery can take from 6 months to one year and signs consent form currently and is given all preoperative instructions for surgery. Also dispensed air fracture walker with  instructions on usage and patient is encouraged to call with any questions prior to procedure

## 2015-12-04 ENCOUNTER — Telehealth: Payer: Self-pay | Admitting: *Deleted

## 2015-12-04 NOTE — Telephone Encounter (Signed)
"  I saw Dr. Paulla Dolly on Monday and scheduled surgery.  I told you I would call you back after I spoke to my wife if I needed to change it.  Does he have anything available on May 9?"  Yes, that date is available.  "Could you switch me to then?"  Yes, I'll get you rescheduled.  You can go ahead and register.  "What time will I need to be there?"  Someone from the surgical center will give you a call the Monday or Friday before and give you the arrival time.

## 2015-12-17 ENCOUNTER — Other Ambulatory Visit: Payer: Self-pay

## 2016-01-13 MED FILL — TERBINAFINE HCL 250 MG TAB: 250 | 30 days supply | Qty: 30 | Fill #2

## 2016-01-27 ENCOUNTER — Encounter: Payer: Self-pay | Admitting: Podiatry

## 2016-01-27 DIAGNOSIS — Z01818 Encounter for other preprocedural examination: Secondary | ICD-10-CM | POA: Diagnosis not present

## 2016-01-27 DIAGNOSIS — M21612 Bunion of left foot: Secondary | ICD-10-CM | POA: Diagnosis not present

## 2016-01-27 DIAGNOSIS — M2012 Hallux valgus (acquired), left foot: Secondary | ICD-10-CM | POA: Diagnosis not present

## 2016-01-27 MED FILL — OXYCODONE-APAP 10-325 TAB: 10-325 | 5 days supply | Qty: 35 | Fill #0

## 2016-01-28 ENCOUNTER — Telehealth: Payer: Self-pay | Admitting: *Deleted

## 2016-01-28 NOTE — Telephone Encounter (Signed)
Post op courtesy call-Pt states he's doing great.  I reviewed Post Op Instruction Sheet basics RICE, remain in the boot at all times even sleeping and especially walking, may open the boot and rotate the foot and ankle if resting, but then go back into the boot, leave the original surgical dressing in place, and take pain medication as directed and call our office with concerns.  Pt states understanding.

## 2016-01-30 NOTE — Progress Notes (Signed)
DOS 01/27/2016 Austin bunionectomy (cutting and moving bone) with pin fixation, possible Aiken osteotomy with wire left foot.

## 2016-02-05 ENCOUNTER — Ambulatory Visit (INDEPENDENT_AMBULATORY_CARE_PROVIDER_SITE_OTHER): Payer: 59 | Admitting: Podiatry

## 2016-02-05 ENCOUNTER — Encounter: Payer: Self-pay | Admitting: Podiatry

## 2016-02-05 ENCOUNTER — Ambulatory Visit (INDEPENDENT_AMBULATORY_CARE_PROVIDER_SITE_OTHER): Payer: 59

## 2016-02-05 VITALS — Temp 96.2°F

## 2016-02-05 DIAGNOSIS — M21619 Bunion of unspecified foot: Secondary | ICD-10-CM

## 2016-02-05 DIAGNOSIS — Z9889 Other specified postprocedural states: Secondary | ICD-10-CM | POA: Diagnosis not present

## 2016-02-05 NOTE — Progress Notes (Signed)
Subjective:     Patient ID: Keith Wright, male   DOB: 1981-06-26, 35 y.o.   MRN: [id number]  HPI patient states that his left foot is doing very well with minimal discomfort or pain   Review of Systems     Objective:   Physical Exam Neurovascular status intact negative Homans sign noted with well coapted incision site left first metatarsal with good alignment with hallux in rectus position    Assessment:     Doing well post Austin osteotomy left    Plan:     X-ray reviewed and advised patient on continued immobilization compression elevation and reappoint again in 3 weeks or earlier if needed  X-ray results indicated the pins are in place the alignment is good with congruence joint surface

## 2016-02-06 ENCOUNTER — Other Ambulatory Visit: Payer: Self-pay

## 2016-02-26 ENCOUNTER — Ambulatory Visit (INDEPENDENT_AMBULATORY_CARE_PROVIDER_SITE_OTHER): Payer: 59 | Admitting: Podiatry

## 2016-02-26 ENCOUNTER — Encounter: Payer: Self-pay | Admitting: Podiatry

## 2016-02-26 ENCOUNTER — Ambulatory Visit (INDEPENDENT_AMBULATORY_CARE_PROVIDER_SITE_OTHER): Payer: 59

## 2016-02-26 VITALS — BP 111/83 | HR 79 | Resp 16

## 2016-02-26 DIAGNOSIS — M21619 Bunion of unspecified foot: Secondary | ICD-10-CM | POA: Diagnosis not present

## 2016-02-26 DIAGNOSIS — Z9889 Other specified postprocedural states: Secondary | ICD-10-CM

## 2016-02-27 NOTE — Progress Notes (Signed)
Subjective:     Patient ID: Keith Wright, male   DOB: [birth year], 35 y.o.   MRN: [id number]  HPI patient states that he is doing excellent with his foot and having minimal discomfort and back into regular shoes   Review of Systems     Objective:   Physical Exam Neurovascular status intact with excellent healing of the first MPJ left with hallux in rectus position good range of motion and wound edges well coapted    Assessment:     Doing well post osteotomy first metatarsal left    Plan:     Reviewed x-rays and allow patient to increase activities and reappoint in 6 weeks or earlier if needed  X-ray report indicated the osteotomy is healing well with pins in place no signs of movement and joint congruence

## 2016-04-01 ENCOUNTER — Other Ambulatory Visit: Payer: 59

## 2016-04-12 ENCOUNTER — Ambulatory Visit (INDEPENDENT_AMBULATORY_CARE_PROVIDER_SITE_OTHER): Payer: 59 | Admitting: Podiatry

## 2016-04-12 ENCOUNTER — Ambulatory Visit (INDEPENDENT_AMBULATORY_CARE_PROVIDER_SITE_OTHER): Payer: 59

## 2016-04-12 DIAGNOSIS — M21619 Bunion of unspecified foot: Secondary | ICD-10-CM

## 2016-04-12 DIAGNOSIS — Z9889 Other specified postprocedural states: Secondary | ICD-10-CM

## 2016-04-14 NOTE — Progress Notes (Signed)
Subjective:     Patient ID: Keith Wright, male   DOB: 1981/07/21, 35 y.o.   MRN: [id number]  HPI patient presents stating I'm doing great with my right foot   Review of Systems     Objective:   Physical Exam Neurovascular status intact everything intact with patient noted to have good healing of the right first MPJ with excellent range of motion    Assessment:     Doing well post Austin osteotomy right    Plan:     Reviewed final x-rays and allow patient to return to normal activity. Reappoint 8 weeks for final visit or earlier if needed  X-ray report indicated osteotomies healing with pin intact and no indication of motion

## 2016-06-11 ENCOUNTER — Ambulatory Visit (INDEPENDENT_AMBULATORY_CARE_PROVIDER_SITE_OTHER): Payer: 59 | Admitting: Podiatry

## 2016-06-11 DIAGNOSIS — M21619 Bunion of unspecified foot: Secondary | ICD-10-CM

## 2016-06-11 NOTE — Patient Instructions (Signed)

## 2016-06-14 NOTE — Progress Notes (Signed)
Subjective:     Patient ID: Keith Wright, male   DOB: 07/21/1981, 35 y.o.   MRN: [id number]  HPI patient presents with structural bunion deformity right after left one was corrected a number of months ago and is doing excellent   Review of Systems     Objective:   Physical Exam Neurovascular status intact muscle strength adequate with patient found to have structural bunion deformity right with inflammation pain around the first metatarsal head and redness with deviation of the hallux against the second toe noted    Assessment:     HAV deformity right with well-healed surgical site left    Plan:     H&P condition reviewed and recommended correction of deformity. At this point I allowed patient to review consent form going over all possible complications alternative treatments and the fact recovery take approximate 6 months. Patient wants surgery signs consent form and is given all preoperative instructions

## 2016-07-12 ENCOUNTER — Ambulatory Visit: Payer: 59 | Admitting: Podiatry

## 2016-07-27 ENCOUNTER — Encounter: Payer: Self-pay | Admitting: Podiatry

## 2016-07-27 DIAGNOSIS — M2011 Hallux valgus (acquired), right foot: Secondary | ICD-10-CM | POA: Diagnosis not present

## 2016-07-27 DIAGNOSIS — Z01818 Encounter for other preprocedural examination: Secondary | ICD-10-CM | POA: Diagnosis not present

## 2016-07-27 DIAGNOSIS — Z472 Encounter for removal of internal fixation device: Secondary | ICD-10-CM | POA: Diagnosis not present

## 2016-07-27 DIAGNOSIS — M2012 Hallux valgus (acquired), left foot: Secondary | ICD-10-CM | POA: Diagnosis not present

## 2016-07-27 DIAGNOSIS — Z4889 Encounter for other specified surgical aftercare: Secondary | ICD-10-CM | POA: Diagnosis not present

## 2016-07-27 DIAGNOSIS — M21611 Bunion of right foot: Secondary | ICD-10-CM | POA: Diagnosis not present

## 2016-07-27 MED FILL — PROMETHAZINE 25 MG TABLET: 25 | 5 days supply | Qty: 20 | Fill #0

## 2016-07-27 MED FILL — OXYCODONE-APAP 10-325: 10-325 | 5 days supply | Qty: 30 | Fill #0

## 2016-08-04 ENCOUNTER — Ambulatory Visit (INDEPENDENT_AMBULATORY_CARE_PROVIDER_SITE_OTHER): Payer: 59

## 2016-08-04 ENCOUNTER — Ambulatory Visit (INDEPENDENT_AMBULATORY_CARE_PROVIDER_SITE_OTHER): Payer: 59 | Admitting: Podiatry

## 2016-08-04 DIAGNOSIS — Z9889 Other specified postprocedural states: Secondary | ICD-10-CM | POA: Diagnosis not present

## 2016-08-04 DIAGNOSIS — M21619 Bunion of unspecified foot: Secondary | ICD-10-CM

## 2016-08-05 NOTE — Progress Notes (Signed)
Subjective:     Patient ID: Keith Wright, male   DOB: Aug 09, 1981, 35 y.o.   MRN: [id number]  HPI patient states that he is doing excellent with minimal discomfort or swelling   Review of Systems     Objective:   Physical Exam Neurovascular status intact with good healing of the osteotomy first metatarsal right with stitches in place left and no signs of pathology    Assessment:     Doing well post Austin osteotomy right with wound edges well coapted and excellent healing of incision site left for removal of pins    Plan:     X-ray right reviewed and advised on continued elevation compression immobilization and sterile dressing reapplied. Explained range of motion exercises and reappoint to recheck again in 3 weeks or earlier if needed  X-rays indicated the osteotomy looks excellent with pins in place and good reduction of deformity

## 2016-08-16 ENCOUNTER — Ambulatory Visit (INDEPENDENT_AMBULATORY_CARE_PROVIDER_SITE_OTHER): Payer: 59 | Admitting: Podiatry

## 2016-08-16 ENCOUNTER — Ambulatory Visit (INDEPENDENT_AMBULATORY_CARE_PROVIDER_SITE_OTHER): Payer: 59

## 2016-08-16 DIAGNOSIS — M21619 Bunion of unspecified foot: Secondary | ICD-10-CM

## 2016-08-16 DIAGNOSIS — Z9889 Other specified postprocedural states: Secondary | ICD-10-CM

## 2016-08-16 NOTE — Progress Notes (Signed)
Subjective:     Patient ID: Keith Wright, male   DOB: [birth year], 35 y.o.   MRN: [id number]  HPI patient states she's doing well but he doesn't seem that the motion is quite as good on the right one as what the left one was   Review of Systems     Objective:   Physical Exam Neurovascular status intact negative Homan sign was noted wound edges well coapted with good alignment of the first MPJ with range of motion that's slightly restricted on the right of approximate 20 dorsiflexion 10 plantar flexion with the left functioning better with approximate 30 and 20    Assessment:     Doing well post osteotomy first metatarsal with mild restriction the joint with well healing surgical site left first metatarsal    Plan:     Advised on the importance of motion and did dispense a brace to plantarflexed the hallux explaining how to use it. Dispensed anklet for compression encourage range of motion exercises and reappoint in 4 weeks or earlier if needed  X-ray report indicated osteotomy is healing well with pins in place and good correction of bone structure with joint congruence

## 2016-08-27 ENCOUNTER — Encounter: Payer: Self-pay | Admitting: Podiatry

## 2016-09-06 NOTE — Progress Notes (Signed)
DOS 11.07.2017 Austin Bunionectomy (cutting and moving bone) with Pin Fixation Right; Removal Pin from 1st Metatarsal Left

## 2016-09-15 ENCOUNTER — Ambulatory Visit (INDEPENDENT_AMBULATORY_CARE_PROVIDER_SITE_OTHER): Payer: 59

## 2016-09-15 ENCOUNTER — Ambulatory Visit (INDEPENDENT_AMBULATORY_CARE_PROVIDER_SITE_OTHER): Payer: Self-pay | Admitting: Podiatry

## 2016-09-15 DIAGNOSIS — Z9889 Other specified postprocedural states: Secondary | ICD-10-CM

## 2016-09-15 DIAGNOSIS — M2011 Hallux valgus (acquired), right foot: Secondary | ICD-10-CM

## 2016-09-15 DIAGNOSIS — M21619 Bunion of unspecified foot: Secondary | ICD-10-CM

## 2016-09-15 NOTE — Progress Notes (Addendum)
Subjective:     Patient ID: Keith Wright, male   DOB: [birth year], 35 y.o.   MRN: [id number]  HPI patient states I'm doing better but still having some swelling when I been on my foot all day   Review of Systems     Objective:   Physical Exam Neurovascular status intact muscle strength adequate with patient's right foot doing better with continued mild edema but improved from previous visit    Assessment:     Doing well from structural bunion deformity right    Plan:     Reviewed x-rays and discussed there still some healing still to occur and I advised him gradual increase in activity and to still be careful with this condition  X-ray report indicated the osteotomy is healing well with pins in place and minimal movement and good correction of deformity

## 2016-10-08 MED FILL — VALACYCLOVIR HCL 500 MG TAB: 500 | 1 days supply | Qty: 4 | Fill #0

## 2016-10-27 ENCOUNTER — Ambulatory Visit (INDEPENDENT_AMBULATORY_CARE_PROVIDER_SITE_OTHER): Payer: 59

## 2016-10-27 ENCOUNTER — Ambulatory Visit (INDEPENDENT_AMBULATORY_CARE_PROVIDER_SITE_OTHER): Payer: Self-pay | Admitting: Podiatry

## 2016-10-27 VITALS — BP 157/113 | HR 84 | Resp 16

## 2016-10-27 DIAGNOSIS — M21619 Bunion of unspecified foot: Secondary | ICD-10-CM

## 2016-10-27 DIAGNOSIS — M2011 Hallux valgus (acquired), right foot: Secondary | ICD-10-CM

## 2016-10-28 NOTE — Progress Notes (Signed)
Subjective: Keith Wright is a 36 y.o. is seen today in office s/p right Liane Comber bunionectomy preformed on 07/27/16 with Dr. Paulla Dolly. He states he is having no pain and he has been running and wearing regular shoes without any issues. He states he is very happy and everything is doing well. Denies any systemic complaints such as fevers, chills, nausea, vomiting. No calf pain, chest pain, shortness of breath.   Objective: General: No acute distress, AAOx3  DP/PT pulses palpable 2/4, CRT < 3 sec to all digits.  Protective sensation intact. Motor function intact.  Right foot: Incision is well healed and a scar is prsent. There is no surrounding erythema, ascending cellulitis, fluctuance, crepitus, malodor, drainage/purulence. There is no edema around the surgical site. There is no pain along the surgical site. No pain with MTPJ ROM but there is mild decrease motion. One of the wires is mildly palpable. No pain, open sores over the area.  No other areas of tenderness to bilateral lower extremities.  No other open lesions or pre-ulcerative lesions.  No pain with calf compression, swelling, warmth, erythema.   Assessment and Plan:  Status post right foot bunion correction, doing well with no complications   -Treatment options discussed including all alternatives, risks, and complications -X-rays were obtained and reviewed with the patient. Hardware intact. One of the wires appears to be prominent. No evidence of acute fracture identified. -Encouraged range of motion exercises and he can continue with regular shoe gear. -Discussed with him he may do have the wires removed. It is currently asymptomatic and we will continue to monitor the area should become a problem we can remove it. -RTC prn  Celesta Gentile, DPM

## 2016-12-27 ENCOUNTER — Ambulatory Visit (INDEPENDENT_AMBULATORY_CARE_PROVIDER_SITE_OTHER): Payer: 59 | Admitting: Podiatry

## 2016-12-27 ENCOUNTER — Encounter: Payer: Self-pay | Admitting: Podiatry

## 2016-12-27 ENCOUNTER — Ambulatory Visit (INDEPENDENT_AMBULATORY_CARE_PROVIDER_SITE_OTHER): Payer: 59

## 2016-12-27 VITALS — BP 147/85 | HR 78 | Resp 16

## 2016-12-27 DIAGNOSIS — M2011 Hallux valgus (acquired), right foot: Secondary | ICD-10-CM

## 2016-12-27 DIAGNOSIS — Z472 Encounter for removal of internal fixation device: Secondary | ICD-10-CM

## 2016-12-27 NOTE — Progress Notes (Signed)
Subjective:     Patient ID: Keith Wright, male   DOB: 04-14-1981, 36 y.o.   MRN: [id number]  HPI patient states the pin site has become real aggravated on my right foot and I bumped indicated and that made it worse   Review of Systems     Objective:   Physical Exam Neurovascular status intact with prominent pin position right first metatarsal localized in nature with no proximal edema erythema or no drainage noted    Assessment:     Foot pain right secondary to trauma with abnormal pin position of the first metatarsal right    Plan:     H&P condition reviewed and at this time I recommended pin removal after reviewing x-rays. I explained the procedure and what we'll be done and explained risk and patient wants surgery signed consent form after review. This will be done in the office and also I advised on ice therapy  X-ray report indicates prominent pin position of the proximal pin right first metatarsal with good healing of the osteotomy with no signs of movement

## 2016-12-30 ENCOUNTER — Ambulatory Visit (INDEPENDENT_AMBULATORY_CARE_PROVIDER_SITE_OTHER): Payer: 59 | Admitting: Podiatry

## 2016-12-30 ENCOUNTER — Encounter: Payer: Self-pay | Admitting: Podiatry

## 2016-12-30 VITALS — BP 140/82 | HR 66 | Temp 96.3°F | Resp 16

## 2016-12-30 DIAGNOSIS — Z472 Encounter for removal of internal fixation device: Secondary | ICD-10-CM

## 2016-12-31 NOTE — Progress Notes (Signed)
DOS 04.12.2018 Office Surgery Removal pin from top right foot.

## 2017-01-02 NOTE — Progress Notes (Signed)
Subjective:     Patient ID: Keith Wright, male   DOB: Aug 17, 1981, 36 y.o.   MRN: [id number]  HPI patient presents for removal of pin from the top of the right first metatarsal stating it's bothering him and making shoe gear difficult   Review of Systems     Objective:   Physical Exam Neurovascular status intact negative Homans sign noted with patient having a prominent first metatarsal shaft irritation with pin that's prominent in this area    Assessment:     Prominent pin position right    Plan:     Patient was anesthetized with 100 mg Xylocaine Marcaine mixture and the patient was taken to the OR and prepped and draped utilizing standard aseptic technique. The patient's right foot was exsanguinated and it was inflated to 250 mmHg around the ankle. The first metatarsal was then exposed and utilizing a 15 blade and incision was made in the first metatarsal shaft. The incision was deepened through subcutaneous tissue down to capsule and a linear Incision Was Performed. Capture Tissue Sharply Dissected off the Underlying Bone and the Offending pin was identified and removed in toto. Wound was flushed was exteriorized and solution and was sutured with 5-0 nylon and sterile dressing applied to right foot with Refill noted be immediate to all digits upon removal of the tourniquet. Patient was taken out of the OR in satisfactory condition

## 2017-01-13 ENCOUNTER — Ambulatory Visit (INDEPENDENT_AMBULATORY_CARE_PROVIDER_SITE_OTHER): Payer: Self-pay | Admitting: Podiatry

## 2017-01-13 ENCOUNTER — Ambulatory Visit (INDEPENDENT_AMBULATORY_CARE_PROVIDER_SITE_OTHER): Payer: 59

## 2017-01-13 DIAGNOSIS — M21619 Bunion of unspecified foot: Secondary | ICD-10-CM

## 2017-01-13 DIAGNOSIS — Z472 Encounter for removal of internal fixation device: Secondary | ICD-10-CM

## 2017-01-13 NOTE — Progress Notes (Signed)
Subjective:    Patient ID: Keith Wright, male   DOB: 36 y.o.   MRN: [id number]   HPI patient states that he is doing fine with minimal discomfort    ROS      Objective:  Physical Exam Neurovascular status intact with patient's incision site right healing well with wound edges well coapted stitches intact    Assessment:      Doing well post pin removal right    Plan:     X-rays reviewed and at this time patient to return to normal activity and will be seen back as needed with good alignment noted  X-ray report indicates satisfactory removal of pin with good alignment noted joint congruence and no indications of significant pathology

## 2017-10-04 DIAGNOSIS — Z7689 Persons encountering health services in other specified circumstances: Secondary | ICD-10-CM | POA: Diagnosis not present

## 2017-10-04 DIAGNOSIS — Z0000 Encounter for general adult medical examination without abnormal findings: Secondary | ICD-10-CM | POA: Diagnosis not present

## 2017-10-04 DIAGNOSIS — R41840 Attention and concentration deficit: Secondary | ICD-10-CM | POA: Diagnosis not present

## 2017-10-05 DIAGNOSIS — F988 Other specified behavioral and emotional disorders with onset usually occurring in childhood and adolescence: Secondary | ICD-10-CM | POA: Insufficient documentation

## 2017-10-06 MED FILL — AMPHETAMINE-DEXTROAMPHETAMI: 10 | 30 days supply | Qty: 60 | Fill #0

## 2017-10-21 DIAGNOSIS — H5213 Myopia, bilateral: Secondary | ICD-10-CM | POA: Diagnosis not present

## 2017-11-04 MED FILL — predniSONE 10 MG TABS: 10 | 6 days supply | Qty: 12 | Fill #0

## 2017-11-04 MED FILL — PROMETHAZINE-DM SYRUP: 6.25-15 | 10 days supply | Qty: 100 | Fill #0

## 2017-11-10 MED FILL — AMPHETAMINE-DEXTROAMPHETAMI: 10 | 30 days supply | Qty: 60 | Fill #0

## 2017-12-21 MED FILL — DEXTROAMP-AMP 10 MG TAB: 10 | 30 days supply | Qty: 60 | Fill #0

## 2017-12-22 DIAGNOSIS — R7301 Impaired fasting glucose: Secondary | ICD-10-CM | POA: Diagnosis not present

## 2017-12-22 DIAGNOSIS — Z0000 Encounter for general adult medical examination without abnormal findings: Secondary | ICD-10-CM | POA: Diagnosis not present

## 2018-01-09 DIAGNOSIS — R41840 Attention and concentration deficit: Secondary | ICD-10-CM | POA: Diagnosis not present

## 2018-01-18 MED FILL — DEXTROAMP-AMP 10 MG TAB: 10 | 30 days supply | Qty: 60 | Fill #0

## 2018-01-25 ENCOUNTER — Other Ambulatory Visit: Payer: Self-pay

## 2018-01-25 ENCOUNTER — Encounter: Payer: Self-pay | Admitting: Emergency Medicine

## 2018-01-25 ENCOUNTER — Emergency Department: Payer: 59

## 2018-01-25 ENCOUNTER — Emergency Department
Admission: EM | Admit: 2018-01-25 | Discharge: 2018-01-25 | Disposition: A | Discharge status: Home / Self Care | Payer: 59 | Attending: Emergency Medicine | Admitting: Emergency Medicine

## 2018-01-25 DIAGNOSIS — R0789 Other chest pain: Secondary | ICD-10-CM | POA: Insufficient documentation

## 2018-01-25 DIAGNOSIS — R079 Chest pain, unspecified: Secondary | ICD-10-CM

## 2018-01-25 LAB — CBC
HEMATOCRIT: 40.8 % (ref 40.0–52.0)
Hemoglobin: 13.7 g/dL (ref 13.0–18.0)
MCH: 29.2 pg (ref 26.0–34.0)
MCHC: 33.6 g/dL (ref 32.0–36.0)
MCV: 87 fL (ref 80.0–100.0)
Platelets: 387 10*3/uL (ref 150–440)
RBC: 4.69 MIL/uL (ref 4.40–5.90)
RDW: 13.9 % (ref 11.5–14.5)
WBC: 9 10*3/uL (ref 3.8–10.6)

## 2018-01-25 LAB — BASIC METABOLIC PANEL
ANION GAP: 6 (ref 5–15)
BUN: 17 mg/dL (ref 6–20)
CHLORIDE: 103 mmol/L (ref 101–111)
CO2: 26 mmol/L (ref 22–32)
Calcium: 9.3 mg/dL (ref 8.9–10.3)
Creatinine, Ser: 1 mg/dL (ref 0.61–1.24)
GFR calc non Af Amer: >60 mL/min (ref >60)
Glucose, Bld: 128 mg/dL — ABNORMAL HIGH (ref 65–99)
POTASSIUM: 3.6 mmol/L (ref 3.5–5.1)
Sodium: 135 mmol/L (ref 135–145)

## 2018-01-25 LAB — TROPONIN I: Troponin I: <0.03 ng/mL (ref <0.03)

## 2018-01-25 MED ORDER — LORAZEPAM 1 MG PO TABS
1.0000 mg | ORAL_TABLET | Freq: Once | ORAL | Status: AC
  Administered 2018-01-25: 1 mg via ORAL
  Filled 2018-01-25: qty 1

## 2018-01-25 MED ORDER — DIAZEPAM 5 MG PO TABS: 5.0000 mg | ORAL_TABLET | Freq: Three times a day (TID) | ORAL | 0 refills | Status: DC | PRN

## 2018-01-25 MED ORDER — IBUPROFEN 800 MG PO TABS: 800.0000 mg | ORAL_TABLET | Freq: Three times a day (TID) | ORAL | 0 refills | Status: DC | PRN

## 2018-01-25 NOTE — ED Notes (Signed)
Pt c/o sudden onset left sided chest pain that radiates into the left shoulder with diaphoresis around 815am while at work. Skin is warm and dry on arrival. Respirations WNL.Marland Kitchen Pt is a/ox4 on arrival wife is at the bedside.

## 2018-01-25 NOTE — ED Notes (Signed)
Onset of CP at 0800, felt like his heart was fluttering.  Describes pain as dull.  Works in [employer]

## 2018-01-25 NOTE — ED Triage Notes (Signed)
Pt c/o left sided chest pain that started about 1 hour PTA. Pt is still having some dull chest pain in the left side. Pt states that he does have some pain in the left upper arm. Pt states that he has had some shortness of breath. Pt denies N/V. Pt is in NAD at this time.

## 2018-01-25 NOTE — ED Provider Notes (Signed)
[facility] Emergency Department Provider Note       Time seen: ----------------------------------------- 10:10 AM on 01/25/2018 -----------------------------------------   I have reviewed the triage vital signs and the nursing notes.  HISTORY   Chief Complaint Chest Pain    HPI Keith Wright is a 37 y.o. male with a history of thrombocytosis who presents to the ED for chest pain that started 1 hour prior to arrival.  Patient states she was having dull chest pain in the left side of his chest and he does have some pain in the left upper arm.  Patient also describes some shortness of breath, denies nausea, vomiting or diarrhea.  He also felt like his heart was fluttering, currently works in the cancer center.  He denies any recent illness or other complaints.  Past Medical History:  Diagnosis Date  . Elevated platelet count   . History of chicken pox     Patient Active Problem List   Diagnosis Date Noted  . Visit for preventive health examination 08/12/2015  . Thrombocytosis ([place]) 08/12/2015  . Onychomycosis 08/12/2015    Past Surgical History:  Procedure Laterality Date  . WISDOM TOOTH EXTRACTION      Allergies Patient has no known allergies.  Social History Social History   Tobacco Use  . Smoking status: Never Smoker  . Smokeless tobacco: Never Used  Substance Use Topics  . Alcohol use: Yes    Alcohol/week: 0.0 oz    Comment: social -- 1 per month  . Drug use: No   Review of Systems Constitutional: Negative for fever. ENT:  Negative for congestion, sore throat Cardiovascular: Positive for chest pain Respiratory: Negative for shortness of breath. Gastrointestinal: Negative for abdominal pain, vomiting and diarrhea. Musculoskeletal: Positive for arm pain Skin: Negative for rash. Neurological: Negative for headaches, focal weakness or numbness.  All systems negative/normal/unremarkable except as stated in the  HPI  ____________________________________________   PHYSICAL EXAM:  VITAL SIGNS: ED Triage Vitals  Enc Vitals Group     BP 01/25/18 0910 (!) 139/91     Pulse Rate 01/25/18 0910 83     Resp 01/25/18 0910 16     Temp 01/25/18 0910 98.4 F (36.9 C)     Temp Source 01/25/18 0910 Oral     SpO2 01/25/18 0910 97 %     Weight 01/25/18 0909 238 lb (108 kg)     Height 01/25/18 0909 6\' 8"  (2.032 m)     Head Circumference --      Peak Flow --      Pain Score 01/25/18 0908 4     Pain Loc --      Pain Edu? --      Excl. in [place]? --    Constitutional: Alert and oriented. Well appearing and in no distress. Eyes: Conjunctivae are normal. Normal extraocular movements. ENT   Head: Normocephalic and atraumatic.   Nose: No congestion/rhinnorhea.   Mouth/Throat: Mucous membranes are moist.   Neck: No stridor. Cardiovascular: Normal rate, regular rhythm. No murmurs, rubs, or gallops. Respiratory: Normal respiratory effort without tachypnea nor retractions. Breath sounds are clear and equal bilaterally. No wheezes/rales/rhonchi. Gastrointestinal: Soft and nontender. Normal bowel sounds Musculoskeletal: Nontender with normal range of motion in extremities. No lower extremity tenderness nor edema. Neurologic:  Normal speech and language. No gross focal neurologic deficits are appreciated.  Skin:  Skin is warm, dry and intact. No rash noted. Psychiatric: Mood and affect are normal. Speech and behavior are normal.  ____________________________________________  EKG: Interpreted by me.  Sinus rhythm with sinus arrhythmia, rate is 85 bpm, normal PR interval, normal QRS, normal QT.  ____________________________________________  ED COURSE:  As part of my medical decision making, I reviewed the following data within the [place] History obtained from family if available, nursing notes, old chart and ekg, as well as notes from prior ED visits. Patient presented for chest  pain, we will assess with labs and imaging as indicated at this time.   Procedures ____________________________________________   LABS (pertinent positives/negatives)  Labs Reviewed  BASIC METABOLIC PANEL - Abnormal; Notable for the following components:      Result Value   Glucose, Bld 128 (*)    All other components within normal limits  CBC  TROPONIN I  TROPONIN I    RADIOLOGY Chest x-ray Is unremarkable ____________________________________________  DIFFERENTIAL DIAGNOSIS   Musculoskeletal pain, arrhythmia, unstable angina, PE, GERD, anxiety  FINAL ASSESSMENT AND PLAN  Chest pain   Plan: The patient had presented for chest pain. Patient's labs were negative including repeat troponin. Patient's imaging was reassuring.  This appears to be a combination likely musculoskeletal pain and anxiety.  I will prescribe Motrin and Valium, he is stable for outpatient follow-up.   Laurence Aly, MD   Note: This note was generated in part or whole with voice recognition software. Voice recognition is usually quite accurate but there are transcription errors that can and very often do occur. I apologize for any typographical errors that were not detected and corrected.     Earleen Newport, MD 01/25/18 [phone]

## 2018-01-31 DIAGNOSIS — R079 Chest pain, unspecified: Secondary | ICD-10-CM | POA: Diagnosis not present

## 2018-01-31 DIAGNOSIS — F4323 Adjustment disorder with mixed anxiety and depressed mood: Secondary | ICD-10-CM | POA: Diagnosis not present

## 2018-02-08 MED FILL — diazePAM 5 MG TABS: 5 | 20 days supply | Qty: 40 | Fill #0

## 2018-02-08 MED FILL — ESCITALOPRAM 10 MG TABLET: 10 | 30 days supply | Qty: 30 | Fill #0

## 2018-03-01 MED FILL — DEXTROAMP-AMP 10 MG TAB: 10 | 30 days supply | Qty: 60 | Fill #0

## 2018-03-08 DIAGNOSIS — F4323 Adjustment disorder with mixed anxiety and depressed mood: Secondary | ICD-10-CM | POA: Diagnosis not present

## 2018-03-08 MED FILL — ESCITALOPRAM 20 MG TABLET: 20 | 30 days supply | Qty: 30 | Fill #0 | Status: TO

## 2018-03-08 MED FILL — diazePAM 5 MG TABS: 5 | 15 days supply | Qty: 30 | Fill #0

## 2018-04-13 MED FILL — DEXTROAMP-AMP 10 MG TAB: 10 | 30 days supply | Qty: 60 | Fill #0

## 2018-04-13 MED FILL — ESCITALOPRAM 20 MG TABLET: 20 | 30 days supply | Qty: 30 | Fill #0

## 2018-05-23 MED FILL — ESCITALOPRAM 20 MG TABLET: 20 | 30 days supply | Qty: 30 | Fill #1

## 2018-05-24 MED FILL — AMPHETAMINE-DEXTROAMPHETAMI: 10 | 30 days supply | Qty: 60 | Fill #0

## 2018-06-21 MED FILL — ESCITALOPRAM 20 MG TABLET: 20 | 30 days supply | Qty: 30 | Fill #2

## 2018-07-19 MED FILL — ESCITALOPRAM 20 MG TABLET: 20 | 30 days supply | Qty: 30 | Fill #0

## 2018-08-11 MED FILL — AMPHETAMINE-DEXTROAMPHETAMI: 10 | 30 days supply | Qty: 60 | Fill #0

## 2018-09-18 MED FILL — AMPHETAMINE-DEXTROAMPHETAMI: 10 | 30 days supply | Qty: 60 | Fill #0

## 2018-10-25 MED FILL — AMPHETAMINE-DEXTROAMPHETAMI: 10 | 30 days supply | Qty: 60 | Fill #0

## 2018-11-27 MED FILL — AMPHETAMINE-DEXTROAMPHETAMI: 10 | 30 days supply | Qty: 60 | Fill #0

## 2018-12-23 MED FILL — AMPHETAMINE-DEXTROAMPHETAMI: 10 | 30 days supply | Qty: 60 | Fill #0

## 2019-01-24 MED FILL — AMPHETAMINE-DEXTROAMPHETAMI: 10 | 30 days supply | Qty: 60 | Fill #0

## 2019-02-26 MED FILL — AMPHETAMINE-DEXTROAMPHETAMI: 10 | 30 days supply | Qty: 60 | Fill #0

## 2019-04-05 MED FILL — AMPHETAMINE-DEXTROAMPHETAMI: 10 | 30 days supply | Qty: 60 | Fill #0

## 2019-05-04 MED FILL — AMPHETAMINE-DEXTROAMPHETAMI: 10 | 30 days supply | Qty: 60 | Fill #0

## 2019-06-05 MED FILL — AMPHETAMINE-DEXTROAMPHETAMI: 10 | 30 days supply | Qty: 60 | Fill #0

## 2019-07-06 MED FILL — AMPHETAMINE-DEXTROAMPHETAMI: 10 | 30 days supply | Qty: 60 | Fill #0

## 2019-08-06 MED FILL — AMPHETAMINE-DEXTROAMPHETAMI: 10 | 30 days supply | Qty: 60 | Fill #0

## 2019-09-04 MED FILL — AMPHETAMINE-DEXTROAMPHETAMI: 10 | 30 days supply | Qty: 60 | Fill #0

## 2019-10-08 MED FILL — AMPHETAMINE-DEXTROAMPHETAMI: 10 | 30 days supply | Qty: 60 | Fill #0

## 2019-11-05 MED FILL — CYCLOBENZAPRINE HCL 10 MG T: 10 | 10 days supply | Qty: 30 | Fill #0

## 2019-11-05 MED FILL — AMPHETAMINE-DEXTROAMPHETAMI: 10 | 30 days supply | Qty: 60 | Fill #0

## 2019-12-05 MED FILL — AMPHETAMINE-DEXTROAMPHETAMI: 10 | 30 days supply | Qty: 60 | Fill #0

## 2020-01-04 MED FILL — AMPHETAMINE-DEXTROAMPHETAMI: 10 | 30 days supply | Qty: 60 | Fill #0

## 2020-02-05 MED FILL — AMPHETAMINE-DEXTROAMPHETAMI: 10 | 15 days supply | Qty: 30 | Fill #0

## 2020-03-04 MED FILL — AMPHETAMINE-DEXTROAMPHETAMI: 10 | 30 days supply | Qty: 60 | Fill #0

## 2020-04-07 MED FILL — AMPHETAMINE SALTS 10 MG: 10 | 30 days supply | Qty: 60 | Fill #0

## 2020-05-05 MED FILL — AMPHETAMINE SALTS 10 MG: 10 | 30 days supply | Qty: 60 | Fill #0

## 2020-05-23 ENCOUNTER — Other Ambulatory Visit: Payer: Self-pay

## 2020-05-23 ENCOUNTER — Encounter: Payer: Self-pay | Admitting: Podiatry

## 2020-05-23 ENCOUNTER — Ambulatory Visit (INDEPENDENT_AMBULATORY_CARE_PROVIDER_SITE_OTHER): Payer: BC Managed Care – PPO | Admitting: Podiatry

## 2020-05-23 ENCOUNTER — Ambulatory Visit (INDEPENDENT_AMBULATORY_CARE_PROVIDER_SITE_OTHER): Payer: BC Managed Care – PPO

## 2020-05-23 DIAGNOSIS — S92911A Unspecified fracture of right toe(s), initial encounter for closed fracture: Secondary | ICD-10-CM

## 2020-05-27 NOTE — Progress Notes (Signed)
Subjective:   Patient ID: Keith Wright, male   DOB: 39 y.o.   MRN: [id number]   HPI Patient presents stating he had a trauma to his right fifth digit and it seems like it sticking out somewhat to the side and this occurred in the last week.  Patient states it sore and he is wondering if it is fractured.  Patient does not smoke likes to be active   Review of Systems  All other systems reviewed and are negative.       Objective:  Physical Exam Vitals and nursing note reviewed.  Constitutional:      Appearance: He is well-developed.  Pulmonary:     Effort: Pulmonary effort is normal.  Musculoskeletal:        General: Normal range of motion.  Skin:    General: Skin is warm.  Neurological:     Mental Status: He is alert.     Neurovascular status was found to be intact muscle strength was found to be adequate range of motion within normal limits.  Patient's fifth digit right is at an abnormal angle and appears to be lead laterally dislocated at the proximal phalanx.  It is moderately tender when pressed and does appear to have some flexibility to it     Assessment:  Probability for displaced fracture fifth digit right not through the skin currently     Plan:  H&P x-rays reviewed and at this point I did a sterile block of the right fifth digit.  After appropriate numbness I was able to pull out of the toe relocate the toe and applied sterile dressing to hold it in place with buddy splinting.  He will keep this in place for several weeks and hopefully will heal uneventfully and will be seen back if any symptoms were to occur  X-rays indicate there is a fracture of the shaft of the fifth digit right with probability for slight displacement

## 2020-06-04 MED FILL — AMPHETAMINE SALTS 10 MG: 10 | 30 days supply | Qty: 60 | Fill #0

## 2020-07-04 MED FILL — AMPHETAMINE SALTS 10 MG: 10 | 30 days supply | Qty: 60 | Fill #0

## 2020-08-01 MED FILL — AMPHETAMINE SALTS 10 MG: 10 | 30 days supply | Qty: 60 | Fill #0

## 2020-08-29 MED FILL — AMPHETAMINE SALTS 10 MG: 10 | 30 days supply | Qty: 60 | Fill #0

## 2020-09-30 ENCOUNTER — Other Ambulatory Visit: Payer: Self-pay

## 2020-09-30 ENCOUNTER — Emergency Department (HOSPITAL_COMMUNITY): Payer: BC Managed Care – PPO

## 2020-09-30 ENCOUNTER — Emergency Department (HOSPITAL_COMMUNITY)
Admission: EM | Admit: 2020-09-30 | Discharge: 2020-09-30 | Disposition: A | Discharge status: Home / Self Care | Payer: BC Managed Care – PPO | Attending: Emergency Medicine | Admitting: Emergency Medicine

## 2020-09-30 ENCOUNTER — Other Ambulatory Visit (HOSPITAL_COMMUNITY): Payer: Self-pay | Admitting: Physician Assistant

## 2020-09-30 DIAGNOSIS — W1839XA Other fall on same level, initial encounter: Secondary | ICD-10-CM | POA: Diagnosis not present

## 2020-09-30 DIAGNOSIS — Y999 Unspecified external cause status: Secondary | ICD-10-CM | POA: Insufficient documentation

## 2020-09-30 DIAGNOSIS — R55 Syncope and collapse: Secondary | ICD-10-CM | POA: Diagnosis not present

## 2020-09-30 DIAGNOSIS — Z23 Encounter for immunization: Secondary | ICD-10-CM | POA: Insufficient documentation

## 2020-09-30 DIAGNOSIS — M542 Cervicalgia: Secondary | ICD-10-CM | POA: Diagnosis not present

## 2020-09-30 DIAGNOSIS — Y92531 Health care provider office as the place of occurrence of the external cause: Secondary | ICD-10-CM | POA: Insufficient documentation

## 2020-09-30 DIAGNOSIS — S0121XA Laceration without foreign body of nose, initial encounter: Secondary | ICD-10-CM | POA: Insufficient documentation

## 2020-09-30 DIAGNOSIS — Y9389 Activity, other specified: Secondary | ICD-10-CM | POA: Diagnosis not present

## 2020-09-30 DIAGNOSIS — S022XXA Fracture of nasal bones, initial encounter for closed fracture: Secondary | ICD-10-CM | POA: Diagnosis not present

## 2020-09-30 LAB — CBC
HCT: 41.2 % (ref 39.0–52.0)
Hemoglobin: 12.7 g/dL — ABNORMAL LOW (ref 13.0–17.0)
MCH: 28.6 pg (ref 26.0–34.0)
MCHC: 30.8 g/dL (ref 30.0–36.0)
MCV: 92.8 fL (ref 80.0–100.0)
Platelets: 316 10*3/uL (ref 150–400)
RBC: 4.44 MIL/uL (ref 4.22–5.81)
RDW: 12.7 % (ref 11.5–15.5)
WBC: 6 10*3/uL (ref 4.0–10.5)
nRBC: 0 % (ref 0.0–0.2)

## 2020-09-30 LAB — BASIC METABOLIC PANEL
Anion gap: 9 (ref 5–15)
BUN: 14 mg/dL (ref 6–20)
CO2: 22 mmol/L (ref 22–32)
Calcium: 9.1 mg/dL (ref 8.9–10.3)
Chloride: 103 mmol/L (ref 98–111)
Creatinine, Ser: 0.97 mg/dL (ref 0.61–1.24)
GFR, Estimated: >60 mL/min (ref >60)
Glucose, Bld: 134 mg/dL — ABNORMAL HIGH (ref 70–99)
Potassium: 4.7 mmol/L (ref 3.5–5.1)
Sodium: 134 mmol/L — ABNORMAL LOW (ref 135–145)

## 2020-09-30 LAB — TROPONIN I (HIGH SENSITIVITY)
Troponin I (High Sensitivity): 3 ng/L (ref <18)
Troponin I (High Sensitivity): 4 ng/L (ref <18)

## 2020-09-30 MED ORDER — SODIUM CHLORIDE 0.9 % IV BOLUS
1000.0000 mL | Freq: Once | INTRAVENOUS | Status: AC
  Administered 2020-09-30: 1000 mL via INTRAVENOUS

## 2020-09-30 MED ORDER — TETANUS-DIPHTH-ACELL PERTUSSIS 5-2.5-18.5 LF-MCG/0.5 IM SUSY
0.5000 mL | PREFILLED_SYRINGE | Freq: Once | INTRAMUSCULAR | Status: AC
  Administered 2020-09-30: 0.5 mL via INTRAMUSCULAR
  Filled 2020-09-30: qty 0.5

## 2020-09-30 MED ORDER — CEPHALEXIN 500 MG PO CAPS: 500.0000 mg | ORAL_CAPSULE | Freq: Two times a day (BID) | ORAL | 0 refills | Status: DC

## 2020-09-30 NOTE — ED Notes (Signed)
Pt transported to Xray at this time.

## 2020-09-30 NOTE — ED Provider Notes (Signed)
[facility] Provider Note   CSN: [id number] Arrival date & time: 09/30/20  1057     History Chief Complaint  Patient presents with  . Loss of Consciousness    Keith Wright is a 40 y.o. male with history significant for ADD, thrombocytosis who presents for evaluation after syncope.  States he was feeling well in his normal state.  Was taking his children's to the doctor's office.  They began walking out of the exam room when he felt lightheaded, clammy, diaphoretic.  He subsequently had a syncopal episode.  No preceding sudden onset mental clap headache, paresthesias, unilateral weakness, chest pain or shortness of breath.  No history of similar.  Denies any anticoagulation.  Unsure of his last tetanus.  Does have some mild neck pain.  C-collar placed by EMS.  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  He does have some chronic left sided shoulder pain however this was present prior to today.  Wife in room does state patient seemed a little confused after the syncopal event.  No tongue biting, urinary incontinence.  Denies additional aggravating or alleviating factors.  History obtained from patient and past medical records.  No Interpreter used.  HPI     Past Medical History:  Diagnosis Date  . Elevated platelet count   . History of chicken pox     Patient Active Problem List   Diagnosis Date Noted  . Attention deficit disorder (ADD) in adult 10/05/2017  . Visit for preventive health examination 08/12/2015  . Thrombocytosis 08/12/2015  . Onychomycosis 08/12/2015    Past Surgical History:  Procedure Laterality Date  . WISDOM TOOTH EXTRACTION         Family History  Problem Relation Age of Onset  . Healthy Father   . Healthy Mother   . Colon cancer Paternal Grandmother 75  . Heart attack Maternal Grandfather   . Healthy Brother        x1  . Healthy Daughter        x4    Social History   Tobacco  Use  . Smoking status: Never Smoker  . Smokeless tobacco: Never Used  Substance Use Topics  . Alcohol use: Yes    Alcohol/week: 0.0 standard drinks    Comment: social -- 1 per month  . Drug use: No    Home Medications Prior to Admission medications   Medication Sig Start Date End Date Taking? Authorizing Provider  amphetamine-dextroamphetamine (ADDERALL) 10 MG tablet Take 1 tablet by mouth 2 (two) times daily. 01/18/18  Yes [provider]  cephALEXin (KEFLEX) 500 MG capsule Take 1 capsule (500 mg total) by mouth 2 (two) times daily for 7 days. 09/30/20 10/07/20 Yes Lennell Shanks A, PA-C    Allergies    Patient has no known allergies.  Review of Systems   Review of Systems  Constitutional: Negative.   HENT: Negative.   Respiratory: Negative.   Cardiovascular: Negative.   Gastrointestinal: Negative.   Genitourinary: Negative.   Musculoskeletal: Negative.   Skin: Positive for wound.  Neurological: Positive for syncope. Negative for dizziness, tremors, seizures, facial asymmetry, speech difficulty, weakness, light-headedness, numbness and headaches.  All other systems reviewed and are negative.  Physical Exam Updated Vital Signs BP (!) 142/96   Pulse 84   Temp 98.9 F (37.2 C) (Oral)   Resp (!) 23   Ht 6\' 8"  (2.032 m)   Wt 97.5 kg   SpO2 100%  BMI 23.62 kg/m   Physical Exam Vitals and nursing note reviewed.  Constitutional:      General: He is not in acute distress.    Appearance: He is well-developed and well-nourished. He is not ill-appearing, toxic-appearing or diaphoretic.  HENT:     Head: Normocephalic and atraumatic.     Nose:     Comments: Dried blood to nares    Mouth/Throat:     Comments: Dentition intact.  Eyes:     Pupils: Pupils are equal, round, and reactive to light.     Comments: PERRLA  Neck:     Comments: C collar in place Cardiovascular:     Rate and Rhythm: Normal rate and regular rhythm.     Pulses: Normal pulses.     Heart  sounds: Normal heart sounds.  Pulmonary:     Effort: Pulmonary effort is normal. No respiratory distress.     Breath sounds: Normal breath sounds.  Abdominal:     General: Bowel sounds are normal. There is no distension.     Palpations: Abdomen is soft.     Tenderness: There is no abdominal tenderness. There is no guarding or rebound.  Musculoskeletal:        General: Normal range of motion.     Cervical back: Normal range of motion and neck supple.     Comments: Moves all 4 extremities without difficulty. No bony tenderness. No crepitus, step offs  Skin:    General: Skin is warm and dry.     Capillary Refill: Capillary refill takes less than 2 seconds.     Comments: Abrasion, laceration between eye on nasal bridge. Abrasion to chin.  Neurological:     General: No focal deficit present.     Mental Status: He is alert and oriented to person, place, and time.     Comments: Mental Status:  Alert, oriented, thought content appropriate. Speech fluent without evidence of aphasia. Able to follow 2 step commands without difficulty.  Cranial Nerves:  II:  Peripheral visual fields grossly normal, pupils equal, round, reactive to light III,IV, VI: ptosis not present, extra-ocular motions intact bilaterally  V,VII: smile symmetric, facial light touch sensation equal IX,X: midline uvula rise  XI: bilateral shoulder shrug equal and strong XII: midline tongue extension  Motor:  5/5 in upper and lower extremities bilaterally including strong and equal grip strength   Cerebellar: normal finger-to-nose with bilateral upper extremities Gait: normal gait and balance  CV: distal pulses palpable throughout   Psychiatric:        Mood and Affect: Mood and affect normal.      ments   Labs (all labs ordered are listed, but only abnormal results are displayed) Labs Reviewed  BASIC METABOLIC PANEL - Abnormal; Notable for the following components:      Result Value   Sodium 134 (*)    Glucose, Bld  134 (*)    All other components within normal limits  CBC - Abnormal; Notable for the following components:   Hemoglobin 12.7 (*)    All other components within normal limits  URINALYSIS, ROUTINE W REFLEX MICROSCOPIC  CBG MONITORING, ED  TROPONIN I (HIGH SENSITIVITY)  TROPONIN I (HIGH SENSITIVITY)    EKG None  Radiology DG Chest 2 View  Result Date: 09/30/2020 CLINICAL DATA:  Syncope. EXAM: CHEST - 2 VIEW COMPARISON:  Jan 25, 2018. FINDINGS: The heart size and mediastinal contours are within normal limits. Both lungs are clear. The visualized skeletal structures are unremarkable. IMPRESSION: No  active cardiopulmonary disease. Electronically Signed   By: Marijo Conception M.D.   On: 09/30/2020 13:33   CT Head Wo Contrast  Result Date: 09/30/2020 CLINICAL DATA:  Syncope, fall with laceration between the ice. Neck stiffness. EXAM: CT HEAD WITHOUT CONTRAST CT MAXILLOFACIAL WITHOUT CONTRAST CT CERVICAL SPINE WITHOUT CONTRAST TECHNIQUE: Multidetector CT imaging of the head, cervical spine, and maxillofacial structures were performed using the standard protocol without intravenous contrast. Multiplanar CT image reconstructions of the cervical spine and maxillofacial structures were also generated. COMPARISON:  Report from CT head dated 01/11/1995 FINDINGS: CT HEAD FINDINGS Brain: The brainstem, cerebellum, cerebral peduncles, thalami, basal ganglia, basilar cisterns, and ventricular system appear within normal limits. No intracranial hemorrhage, mass lesion, or acute CVA. Vascular: Unremarkable Skull: Unremarkable Other: Chronic bilateral maxillary and ethmoid sinusitis. CT MAXILLOFACIAL FINDINGS Osseous: Bilateral nasal bone fractures are present with now mild displacement of the distal tip of the nasal bone is shown on image 55 of series 8. There is also some abrupt angulation of the nasal septum for example on image 67 of series 4 suspicious for a possible nasal septal fracture. Orbits: Unremarkable  Sinuses: Chronic bilateral maxillary sinusitis, right greater than left. Mild chronic ethmoid sinusitis. Soft tissues: Soft tissue swelling along the nose. CT CERVICAL SPINE FINDINGS Alignment: No vertebral subluxation is observed. Skull base and vertebrae: No fracture or acute bony findings. Soft tissues and spinal canal: Unremarkable Disc levels: Suspected left foraminal stenosis at C3-4, C4-5, C5-6, and C6-7; and right foraminal stenosis at C4-5, C5-6, and C6-7; due to spondylosis and degenerative disc disease. Suspected central narrowing of the thecal sac at C6-7 due to right paracentral disc protrusion. Upper chest: Unremarkable Other: No supplemental non-categorized findings. IMPRESSION: 1. Bilateral nasal bone fractures with now mild displacement of the distal tip of the nasal bone. There is also some angulation of the nasal septum suspicious for nasal septal fracture. 2. No acute intracranial findings or acute cervical spine findings. 3. Cervical spondylosis and degenerative disc disease causing multilevel impingement. 4. Chronic bilateral maxillary and ethmoid sinusitis. Electronically Signed   By: Van Clines M.D.   On: 09/30/2020 14:50   CT Cervical Spine Wo Contrast  Result Date: 09/30/2020 CLINICAL DATA:  Syncope, fall with laceration between the ice. Neck stiffness. EXAM: CT HEAD WITHOUT CONTRAST CT MAXILLOFACIAL WITHOUT CONTRAST CT CERVICAL SPINE WITHOUT CONTRAST TECHNIQUE: Multidetector CT imaging of the head, cervical spine, and maxillofacial structures were performed using the standard protocol without intravenous contrast. Multiplanar CT image reconstructions of the cervical spine and maxillofacial structures were also generated. COMPARISON:  Report from CT head dated 01/11/1995 FINDINGS: CT HEAD FINDINGS Brain: The brainstem, cerebellum, cerebral peduncles, thalami, basal ganglia, basilar cisterns, and ventricular system appear within normal limits. No intracranial hemorrhage, mass  lesion, or acute CVA. Vascular: Unremarkable Skull: Unremarkable Other: Chronic bilateral maxillary and ethmoid sinusitis. CT MAXILLOFACIAL FINDINGS Osseous: Bilateral nasal bone fractures are present with now mild displacement of the distal tip of the nasal bone is shown on image 55 of series 8. There is also some abrupt angulation of the nasal septum for example on image 67 of series 4 suspicious for a possible nasal septal fracture. Orbits: Unremarkable Sinuses: Chronic bilateral maxillary sinusitis, right greater than left. Mild chronic ethmoid sinusitis. Soft tissues: Soft tissue swelling along the nose. CT CERVICAL SPINE FINDINGS Alignment: No vertebral subluxation is observed. Skull base and vertebrae: No fracture or acute bony findings. Soft tissues and spinal canal: Unremarkable Disc levels: Suspected left foraminal stenosis at  C3-4, C4-5, C5-6, and C6-7; and right foraminal stenosis at C4-5, C5-6, and C6-7; due to spondylosis and degenerative disc disease. Suspected central narrowing of the thecal sac at C6-7 due to right paracentral disc protrusion. Upper chest: Unremarkable Other: No supplemental non-categorized findings. IMPRESSION: 1. Bilateral nasal bone fractures with now mild displacement of the distal tip of the nasal bone. There is also some angulation of the nasal septum suspicious for nasal septal fracture. 2. No acute intracranial findings or acute cervical spine findings. 3. Cervical spondylosis and degenerative disc disease causing multilevel impingement. 4. Chronic bilateral maxillary and ethmoid sinusitis. Electronically Signed   By: Gaylyn Rong M.D.   On: 09/30/2020 14:50   CT Maxillofacial Wo Contrast  Result Date: 09/30/2020 CLINICAL DATA:  Syncope, fall with laceration between the ice. Neck stiffness. EXAM: CT HEAD WITHOUT CONTRAST CT MAXILLOFACIAL WITHOUT CONTRAST CT CERVICAL SPINE WITHOUT CONTRAST TECHNIQUE: Multidetector CT imaging of the head, cervical spine, and  maxillofacial structures were performed using the standard protocol without intravenous contrast. Multiplanar CT image reconstructions of the cervical spine and maxillofacial structures were also generated. COMPARISON:  Report from CT head dated 01/11/1995 FINDINGS: CT HEAD FINDINGS Brain: The brainstem, cerebellum, cerebral peduncles, thalami, basal ganglia, basilar cisterns, and ventricular system appear within normal limits. No intracranial hemorrhage, mass lesion, or acute CVA. Vascular: Unremarkable Skull: Unremarkable Other: Chronic bilateral maxillary and ethmoid sinusitis. CT MAXILLOFACIAL FINDINGS Osseous: Bilateral nasal bone fractures are present with now mild displacement of the distal tip of the nasal bone is shown on image 55 of series 8. There is also some abrupt angulation of the nasal septum for example on image 67 of series 4 suspicious for a possible nasal septal fracture. Orbits: Unremarkable Sinuses: Chronic bilateral maxillary sinusitis, right greater than left. Mild chronic ethmoid sinusitis. Soft tissues: Soft tissue swelling along the nose. CT CERVICAL SPINE FINDINGS Alignment: No vertebral subluxation is observed. Skull base and vertebrae: No fracture or acute bony findings. Soft tissues and spinal canal: Unremarkable Disc levels: Suspected left foraminal stenosis at C3-4, C4-5, C5-6, and C6-7; and right foraminal stenosis at C4-5, C5-6, and C6-7; due to spondylosis and degenerative disc disease. Suspected central narrowing of the thecal sac at C6-7 due to right paracentral disc protrusion. Upper chest: Unremarkable Other: No supplemental non-categorized findings. IMPRESSION: 1. Bilateral nasal bone fractures with now mild displacement of the distal tip of the nasal bone. There is also some angulation of the nasal septum suspicious for nasal septal fracture. 2. No acute intracranial findings or acute cervical spine findings. 3. Cervical spondylosis and degenerative disc disease causing  multilevel impingement. 4. Chronic bilateral maxillary and ethmoid sinusitis. Electronically Signed   By: Gaylyn Rong M.D.   On: 09/30/2020 14:50    Procedures .Marland KitchenLaceration Repair  Date/Time: 09/30/2020 5:22 PM Performed by: Linwood Dibbles, PA-C Authorized by: Linwood Dibbles, PA-C   Consent:    Consent obtained:  Verbal   Consent given by:  Patient   Risks discussed:  Infection, need for additional repair, pain, poor cosmetic result and poor wound healing   Alternatives discussed:  No treatment and delayed treatment Universal protocol:    Procedure explained and questions answered to patient or proxy's satisfaction: yes     Relevant documents present and verified: yes     Test results available: yes     Imaging studies available: yes     Required blood products, implants, devices, and special equipment available: yes     Site/side marked: yes  Immediately prior to procedure, a time out was called: yes     Patient identity confirmed:  Verbally with patient Anesthesia:    Anesthesia method:  None Laceration details:    Location:  Face   Face location:  Nose   Length (cm):  0.4   Depth (mm):  2 Pre-procedure details:    Preparation:  Patient was prepped and draped in usual sterile fashion and imaging obtained to evaluate for foreign bodies Exploration:    Hemostasis achieved with:  Direct pressure   Wound extent: no foreign bodies/material noted, no muscle damage noted, no nerve damage noted, no tendon damage noted, no underlying fracture noted and no vascular damage noted     Contaminated: no   Treatment:    Irrigation solution:  Sterile saline Skin repair:    Repair method:  Tissue adhesive Approximation:    Approximation:  Close Repair type:    Repair type:  Simple Post-procedure details:    Dressing:  Open (no dressing)   Procedure completion:  Tolerated well, no immediate complications   (including critical care time)  Medications Ordered in  ED Medications  sodium chloride 0.9 % bolus 1,000 mL (0 mLs Intravenous Stopped 09/30/20 1709)  Tdap (BOOSTRIX) injection 0.5 mL (0.5 mLs Intramuscular Given 09/30/20 1401)   ED Course  I have reviewed the triage vital signs and the nursing notes.  Pertinent labs & imaging results that were available during my care of the patient were reviewed by me and considered in my medical decision making (see chart for details).  39 year old, otherwise healthy male presents for evaluation of syncope.  He has abrasions lacerations to face.  He has a nonfocal neuro exam without deficits.  No preceding headache, paresthesias, weakness, chest pain or shortness of breath.  No anticoagulation.  We will plan on labs, imaging and reassess.  Will update tetanus.  Labs and imaging personally reviewed and interpreted:  CBC without leukocytosis, hemoglobin similar to prior Metabolic panel with sodium 134, glucose 134, no additional electrolyte, renal or liver abnormality CT head without any significant finding CT cervical with degenerative changes, disc bulge at C6. CT-max face with nasal fracture DG chest without significant abnormality EKG without ischemic changes Trop 3>>> 4, flat  Patient reassessed.  Feeling improved.  Nasal bridge, superficial laceration/skin tear cleaned, placed Dermabond.  No evidence of septal hematoma.  Discussed nasal fracture.  Will need to follow-up outpatient with plastics versus ENT for this.  Patient ambulatory that ataxic gait.  Heart rate 100%.  At this time of low suspicion for acute arrhythmia, ACS, PE, dissection as cause of his syncope.  Unclear etiology of patient's syncope however reassuring exam and work-up here.  Discussed rest, close up with PCP, return for any worsening symptoms.  Patient family agreeable to this.  Patient without history of congestive heart failure, normal hematocrit, normal ECG, no shortness of breath and systolic blood pressure greater than 90;  patient is low risk. Will plan for discharge home with close cardiology/PCP follow-up.  Possibility of recurrent syncope has been discussed. I discussed reasons to avoid driving until cardiology/PCP followup and other safety prevention including use of ladders and working at heights.   Pt has remained hemodynamically stable throughout their time in the ED  The patient has been appropriately medically screened and/or stabilized in the ED. I have low suspicion for any other emergent medical condition which would require further screening, evaluation or treatment in the ED or require inpatient management.  Patient is hemodynamically stable  and in no acute distress.  Patient able to ambulate in department prior to ED.  Evaluation does not show acute pathology that would require ongoing or additional emergent interventions while in the emergency department or further inpatient treatment.  I have discussed the diagnosis with the patient and answered all questions.  Pain is been managed while in the emergency department and patient has no further complaints prior to discharge.  Patient is comfortable with plan discussed in room and is stable for discharge at this time.  I have discussed strict return precautions for returning to the emergency department.  Patient was encouraged to follow-up with PCP/specialist refer to at discharge.     MDM Rules/Calculators/A&P                           Final Clinical Impression(s) / ED Diagnoses Final diagnoses:  Syncope, unspecified syncope type  Closed fracture of nasal bone, initial encounter  Laceration of nose, initial encounter    Rx / DC Orders ED Discharge Orders         Ordered    cephALEXin (KEFLEX) 500 MG capsule  2 times daily        09/30/20 1808           Foye Damron A, PA-C 09/30/20 1812    Dorie Rank, MD 10/01/20 [id number]

## 2020-09-30 NOTE — ED Notes (Signed)
Pt ambulated and remained at 100% O2, HR went up to 110bpm.

## 2020-09-30 NOTE — ED Notes (Signed)
Troponin obtained and walked to lab.

## 2020-09-30 NOTE — Discharge Instructions (Addendum)
I have placed order for  Keflex given you had a small laceration and a fracture.  Your laceration was not open to the fracture.  I would recommend following up with either plastic surgery or an ear nose and throat provider for evaluation of a fractured nose.

## 2020-09-30 NOTE — ED Triage Notes (Addendum)
Pt presents via EMS, was standing at MD office with his children. Standing in line, became dizzy, had syncopal episode. Lac between eyes, bleeding controlled. A/O x 4. Endorses neck stiffness, C collar applied by EMS.

## 2020-09-30 NOTE — ED Notes (Signed)
Reviewed discharge instructions with patient and spouse. Follow-up care and medications reviewed. Patient and spouse verbalized understanding. Patient A&Ox4, VSS, and ambulatory with steady gait upon discharge.  °

## 2020-10-01 MED FILL — CEPHALEXIN 500 MG CAPSULE: 500 | 7 days supply | Qty: 14 | Fill #0

## 2020-10-02 MED FILL — MAGIC MW (MAAL,LIDO,BEN): 10 days supply | Qty: 400 | Fill #0

## 2020-10-06 DIAGNOSIS — Z8619 Personal history of other infectious and parasitic diseases: Secondary | ICD-10-CM | POA: Insufficient documentation

## 2020-10-06 DIAGNOSIS — R7989 Other specified abnormal findings of blood chemistry: Secondary | ICD-10-CM | POA: Insufficient documentation

## 2020-10-07 DIAGNOSIS — J342 Deviated nasal septum: Secondary | ICD-10-CM | POA: Insufficient documentation

## 2020-10-07 DIAGNOSIS — S0181XA Laceration without foreign body of other part of head, initial encounter: Secondary | ICD-10-CM | POA: Insufficient documentation

## 2020-10-07 DIAGNOSIS — S022XXD Fracture of nasal bones, subsequent encounter for fracture with routine healing: Secondary | ICD-10-CM | POA: Insufficient documentation

## 2020-10-08 ENCOUNTER — Other Ambulatory Visit: Payer: Self-pay

## 2020-10-08 ENCOUNTER — Other Ambulatory Visit: Payer: Self-pay | Admitting: Cardiology

## 2020-10-08 ENCOUNTER — Ambulatory Visit (INDEPENDENT_AMBULATORY_CARE_PROVIDER_SITE_OTHER): Payer: BC Managed Care – PPO

## 2020-10-08 ENCOUNTER — Encounter: Payer: Self-pay | Admitting: Cardiology

## 2020-10-08 ENCOUNTER — Ambulatory Visit (INDEPENDENT_AMBULATORY_CARE_PROVIDER_SITE_OTHER): Payer: BC Managed Care – PPO | Admitting: Cardiology

## 2020-10-08 VITALS — BP 130/88 | HR 92 | Ht >= 80 in | Wt 214.0 lb

## 2020-10-08 DIAGNOSIS — R55 Syncope and collapse: Secondary | ICD-10-CM | POA: Diagnosis not present

## 2020-10-08 DIAGNOSIS — R072 Precordial pain: Secondary | ICD-10-CM

## 2020-10-08 DIAGNOSIS — R079 Chest pain, unspecified: Secondary | ICD-10-CM

## 2020-10-08 MED ORDER — METOPROLOL TARTRATE 100 MG PO TABS: ORAL_TABLET | ORAL | 0 refills | Status: DC

## 2020-10-08 MED FILL — METOPROLOL TARTRATE 100 MG: 100 | 1 days supply | Qty: 1 | Fill #0

## 2020-10-08 NOTE — Progress Notes (Signed)
Cardiology Office Note:    Date:  10/08/2020   ID:  Keith Wright, DOB 08-12-1981, MRN [id number]  PCP:  Bartholome Bill, MD  Cardiologist:  Berniece Salines, DO  Electrophysiologist:  None   Referring MD: No ref. provider found   " I recently passed out"  History of Present Illness:    Keith Wright is a 40 y.o. male with a hx of syncope at age 36 when he was playing basketball and he passed out. During that time he was worked he had a stress test as well as an echocardiogram he was told he has LVH and he was educated that this could have been affects heart. Since that time he has not follow-up with cardiology because there was not any. Patient is athletic he is active and that recently he was at a pediatrician office where he took his daughters to be seen and during that time while he is checking his daughter and he had acute abrupt onset of syncope. He said he felt sweaty and as he tried to keep himself together he passed out. He is not sure how long he was out for.  He is here today with his wife. He tells me that he also has had some intermittent chest pain. He describes it as a diffuse chest discomfort. This is mostly new. No other issues or concern at this time.  Past Medical History:  Diagnosis Date  . Elevated platelet count   . History of chicken pox     Past Surgical History:  Procedure Laterality Date  . WISDOM TOOTH EXTRACTION      Current Medications: Current Meds  Medication Sig  . amphetamine-dextroamphetamine (ADDERALL) 10 MG tablet Take 1 tablet by mouth 2 (two) times daily.  . metoprolol tartrate (LOPRESSOR) 100 MG tablet Take 1 tablet by mouth 2 hours prior to your cardiac ct     Allergies:   Patient has no known allergies.   Social History   Socioeconomic History  . Marital status: Married    Spouse name: Not on file  . Number of children: Not on file  . Years of education: Not on file  . Highest education level: Not on file  Occupational History  .  Occupation: Pharmacist  Tobacco Use  . Smoking status: Never Smoker  . Smokeless tobacco: Never Used  Substance and Sexual Activity  . Alcohol use: Yes    Alcohol/week: 0.0 standard drinks    Comment: social -- 1 per month  . Drug use: No  . Sexual activity: Yes    Partners: Female  Other Topics Concern  . Not on file  Social History Narrative  . Not on file   Social Determinants of Health   Financial Resource Strain: Not on file  Food Insecurity: Not on file  Transportation Needs: Not on file  Physical Activity: Not on file  Stress: Not on file  Social Connections: Not on file     Family History: The patient's family history includes Colon cancer (age of onset: 27) in his paternal grandmother; Healthy in his brother, daughter, father, and mother; Heart attack in his maternal grandfather.  ROS:   Review of Systems  Constitution: Negative for decreased appetite, fever and weight gain.  HENT: Negative for congestion, ear discharge, hoarse voice and sore throat.   Eyes: Negative for discharge, redness, vision loss in right eye and visual halos.  Cardiovascular: Reports syncope  And or chest pain, dyspnea on exertion, leg swelling, orthopnea and palpitations.  Respiratory: Negative for cough, hemoptysis, shortness of breath and snoring.   Endocrine: Negative for heat intolerance and polyphagia.  Hematologic/Lymphatic: Negative for bleeding problem. Does not bruise/bleed easily.  Skin: Negative for flushing, nail changes, rash and suspicious lesions.  Musculoskeletal: Negative for arthritis, joint pain, muscle cramps, myalgias, neck pain and stiffness.  Gastrointestinal: Negative for abdominal pain, bowel incontinence, diarrhea and excessive appetite.  Genitourinary: Negative for decreased libido, genital sores and incomplete emptying.  Neurological: Negative for brief paralysis, focal weakness, headaches and loss of balance.  Psychiatric/Behavioral: Negative for altered mental  status, depression and suicidal ideas.  Allergic/Immunologic: Negative for HIV exposure and persistent infections.    EKGs/Labs/Other Studies Reviewed:    The following studies were reviewed today:   EKG: Sinus rhythm, heart rate 90 bpm with biatrial enlargement,  Recent Labs: 09/30/2020: BUN 14; Creatinine, Ser 0.97; Hemoglobin 12.7; Platelets 316; Potassium 4.7; Sodium 134  Recent Lipid Panel    Component Value Date/Time   CHOL 177 08/13/2015 0759   TRIG 55.0 08/13/2015 0759   HDL 46.90 08/13/2015 0759   CHOLHDL 4 08/13/2015 0759   VLDL 11.0 08/13/2015 0759   LDLCALC 119 (H) 08/13/2015 0759    Physical Exam:    VS:  BP 130/88 (BP Location: Right Arm)   Pulse 92   Ht 6' 8" (2.032 m)   Wt 214 lb (97.1 kg)   SpO2 98%   BMI 23.51 kg/m     Wt Readings from Last 3 Encounters:  10/08/20 214 lb (97.1 kg)  09/30/20 215 lb (97.5 kg)  01/25/18 238 lb (108 kg)     GEN: Well nourished, well developed in no acute distress HEENT: Normal NECK: No JVD; No carotid bruits LYMPHATICS: No lymphadenopathy CARDIAC: S1S2 noted,RRR, no murmurs, rubs, gallops RESPIRATORY:  Clear to auscultation without rales, wheezing or rhonchi  ABDOMEN: Soft, non-tender, non-distended, +bowel sounds, no guarding. EXTREMITIES: No edema, No cyanosis, no clubbing MUSCULOSKELETAL:  No deformity  SKIN: Warm and dry NEUROLOGIC:  Alert and oriented x 3, non-focal PSYCHIATRIC:  Normal affect, good insight  ASSESSMENT:    1. Syncope, unspecified syncope type   2. Precordial chest pain   3. Chest pain, unspecified type    PLAN:     I would like to rule out a cardiovascular etiology of this syncope, therefore at this time I would like to placed a zio patch for   14 days. In additon a transthoracic echocardiogram will be ordered to assess LV/RV function and any structural abnormalities. Once these testing have been performed amd reviewed further reccomendations will be made. For now, I do reccomend that  the patient goes to the nearest ED if  symptoms recur.  His chest pain is concerning patient does have  risk for coronary artery disease would not like to do is pursue an ischemic evaluation in this patient.  Shared decision a coronary CTA at this time is appropriate.  I have discussed with the patient about the testing.  The patient has no IV contrast allergy and is agreeable to proceed with this test.  The patient is in agreement with the above plan. The patient left the office in stable condition.  The patient will follow up in 12 weeks or sooner if needed.   Medication Adjustments/Labs and Tests Ordered: Current medicines are reviewed at length with the patient today.  Concerns regarding medicines are outlined above.  Orders Placed This Encounter  Procedures  . CT CORONARY MORPH W/CTA COR W/SCORE W/CA W/CM &/OR WO/CM  .  CT CORONARY FRACTIONAL FLOW RESERVE DATA PREP  . CT CORONARY FRACTIONAL FLOW RESERVE FLUID ANALYSIS  . Basic metabolic panel  . LONG TERM MONITOR-LIVE TELEMETRY (3-14 DAYS)  . EKG 12-Lead   Meds ordered this encounter  Medications  . metoprolol tartrate (LOPRESSOR) 100 MG tablet    Sig: Take 1 tablet by mouth 2 hours prior to your cardiac ct    Dispense:  1 tablet    Refill:  0    Patient Instructions  Medication Instructions:  Your physician recommends that you continue on your current medications as directed. Please refer to the Current Medication list given to you today.  *If you need a refill on your cardiac medications before your next appointment, please call your pharmacy*   Lab Work: None ordered today, but may need some before the Cardiac CT.  If so, they will let you know when they call you to schedule your CT. If you will need it, you can just come to the office anytime after 8:00 to have this done:  BMET  If you have labs (blood work) drawn today and your tests are completely normal, you will receive your results only by: Marland Kitchen MyChart Message (if you  have MyChart) OR . A paper copy in the mail If you have any lab test that is abnormal or we need to change your treatment, we will call you to review the results.   Testing/Procedures:  Your physician has requested that you have an echocardiogram. Echocardiography is a painless test that uses sound waves to create images of your heart. It provides your doctor with information about the size and shape of your heart and how well your heart's chambers and valves are working. This procedure takes approximately one hour. There are no restrictions for this procedure.   Your physician has requested that you have cardiac CT. Cardiac computed tomography (CT) is a painless test that uses an x-ray machine to take clear, detailed pictures of your heart. For further information please visit [email or web address]. Please follow instruction sheet BELOW:  Your cardiac CT will be scheduled at   [facility] [place], [place] [address] [phone]   Please arrive at the [facility] main entrance of [facility] 30 minutes prior to test start time. Proceed to the [facility] Radiology Department (first floor) to check-in and test prep.   Please follow these instructions carefully (unless otherwise directed):  Hold all erectile dysfunction medications at least 3 days (72 hrs) prior to test.  On the Night Before the Test: . Be sure to Drink plenty of water. . Do not consume any caffeinated/decaffeinated beverages or chocolate 12 hours prior to your test. . Do not take any antihistamines 12 hours prior to your test.   On the Day of the Test: . Drink plenty of water. Do not drink any water within one hour of the test. . Do not eat any food 4 hours prior to the test. . You may take your regular medications prior to the test.  . Take metoprolol (Lopressor) two hours prior to test.    *For Clinical Staff only. Please instruct patient the following:*        -Drink  plenty of water       -Hold Furosemide/hydrochlorothiazide morning of the test       -Take metoprolol (Lopressor) 2 hours prior to test (if applicable).                  -  If HR is less than 55 BPM- No Lopressor                -IF HR is greater than 55 BPM and patient is less than or equal to 73 yrs old Lopressor 160m x1.                -If HR is greater than 55 BPM and patient is greater than 748yrs old Lopressor 50 mg x1.     Do not give Lopressor to patients with an allergy to lopressor or anyone with asthma or active COPD symptoms (currently taking steroids).       After the Test: . Drink plenty of water. . After receiving IV contrast, you may experience a mild flushed feeling. This is normal. . On occasion, you may experience a mild rash up to 24 hours after the test. This is not dangerous. If this occurs, you can take Benadryl 25 mg and increase your fluid intake. . If you experience trouble breathing, this can be serious. If it is severe call 911 IMMEDIATELY. If it is mild, please call our office. . If you take any of these medications: Glipizide/Metformin, Avandament, Glucavance, please do not take 48 hours after completing test unless otherwise instructed.   Once we have confirmed authorization from your insurance company, we will call you to set up a date and time for your test. Based on how quickly your insurance processes prior authorizations requests, please allow up to 4 weeks to be contacted for scheduling your Cardiac CT appointment. Be advised that routine Cardiac CT appointments could be scheduled as many as 8 weeks after your provider has ordered it.  For non-scheduling related questions, please contact the cardiac imaging nurse navigator should you have any questions/concerns: SMarchia Bond Cardiac Imaging Nurse Navigator MBurley Saver Interim Cardiac Imaging Nurse [place] Direct Office Dial: [phone]  For scheduling needs,  including cancellations and rescheduling, please call [place] [phone]      ZIO AT Long term monitor-Live Telemetry  Your physician has requested you wear a ZIO patch monitor for 14 days.  This is a single patch monitor. [employer] supplies one patch monitor per enrollment. Additional stickers are not available.  Please do not apply patch if you will be having a Nuclear Stress Test, Echocardiogram, Cardiac CT, MRI, or Chest Xray during the time frame you would be wearing the monitor. The patch cannot be worn during these tests. You cannot remove and re-apply the ZIO AT patch monitor.   Your ZIO patch monitor will be sent Fed Ex from [employer] to your home address. The monitor may also be mailed to a PO BOX if home delivery is not available. It may take 3-5 days to receive your monitor after you have been enrolled.  Once you have received you monitor, please review enclosed instructions. Your monitor has already been registered assigning a specific monitor serial # to you.   Applying the monitor  Shave hair from upper left chest.  Hold abrader disc by orange tab. Rub abrader in 40 strokes over left upper chest as indicated in your monitor instructions.  Clean area with 4 enclosed alcohol pads. Use all pads to ensure the area is cleaned thoroughly. Let dry.  Apply patch as indicated in monitor instructions. Patch will be placed under collarbone on left side of chest with arrow pointing upward.  Rub patch adhesive wings for 2 minutes. Remove the white label marked "1".  Remove the white label marked "2". Rub patch adhesive wings for 2 additional minutes.  While looking in a mirror, press and release button in center of patch. A small green light will flash 3-4 times. This will be your only indicator the monitor has been turned on.  Do not shower for the first 24 hours. You may shower after the first 24 hours.  Press the button if you feel a symptom. You will hear a small  click. Record Date, Time and Symptom in the Patient Log.   Starting the Gateway  In your kit there is a Hydrographic surveyor box the size of a cellphone. This is Airline pilot. It transmits all your recorded data to [facility]. This box must stay within 10 feet of you at all times. Open the box and push the * button. There will be a light that blinks orange and then green a few times. When the light stops blinking, the Gateway is connected to the ZIO patch.  Call [employer] at [phone] to confirm your monitor is transmitting.   Returning your monitor  Remove your patch and place it inside the [place]. In the lower half of the Gateway there is a white bag with prepaid postage on it. Place Gateway in bag and seal. Mail package back to [place] as soon as possible. Your physician should have your final report approximately 7 days after you have mailed back your monitor.   Call [place] at [phone] if you have questions regarding your ZIO AT patch monitor. Call them immediately if you see an orange light blinking on your monitor.  If your monitor falls off in less than 4 days contact our Monitor department at [phone]. If your monitor becomes loose or falls off after 4 days call [employer] at [phone] for suggestions on securing your monitor.        Follow-Up: At [facility], you and your health needs are our priority.  As part of our continuing mission to provide you with exceptional heart care, we have created designated Provider Care Teams.  These Care Teams include your primary Cardiologist (physician) and Advanced Practice Providers (APPs -  Physician Assistants and Nurse Practitioners) who all work together to provide you with the care you need, when you need it.  We recommend signing up for the patient portal called "MyChart".  Sign up information is provided on this After Visit Summary.  MyChart is used to connect with patients for Virtual Visits (Telemedicine).   Patients are able to view lab/test results, encounter notes, upcoming appointments, etc.  Non-urgent messages can be sent to your provider as well.   To learn more about what you can do with MyChart, go to [email or web address].    Your next appointment:   12 week(s)  The format for your next appointment:   In Person  Provider:   Berniece Salines, DO   Other Instructions     Adopting a Healthy Lifestyle.  Know what a healthy weight is for you (roughly BMI <25) and aim to maintain this   Aim for 7+ servings of fruits and vegetables daily   65-80+ fluid ounces of water or unsweet tea for healthy kidneys   Limit to max 1 drink of alcohol per day; avoid smoking/tobacco   Limit animal fats in diet for cholesterol and heart health - choose grass fed whenever available   Avoid highly processed foods, and foods high in saturated/trans fats   Aim for low stress - take time to unwind  and care for your mental health   Aim for 150 min of moderate intensity exercise weekly for heart health, and weights twice weekly for bone health   Aim for 7-9 hours of sleep daily   When it comes to diets, agreement about the perfect plan isnt easy to find, even among the experts. Experts at the [employer] developed an idea known as the Healthy Eating Plate. Just imagine a plate divided into logical, healthy portions.   The emphasis is on diet quality:   Load up on vegetables and fruits - one-half of your plate: Aim for color and variety, and remember that potatoes dont count.   Go for whole grains - one-quarter of your plate: Whole wheat, barley, wheat berries, quinoa, oats, brown rice, and foods made with them. If you want pasta, go with whole wheat pasta.   Protein power - one-quarter of your plate: Fish, chicken, beans, and nuts are all healthy, versatile protein sources. Limit red meat.   The diet, however, does go beyond the plate, offering a few other suggestions.    Use healthy plant oils, such as olive, canola, soy, corn, sunflower and peanut. Check the labels, and avoid partially hydrogenated oil, which have unhealthy trans fats.   If youre thirsty, drink water. Coffee and tea are good in moderation, but skip sugary drinks and limit milk and dairy products to one or two daily servings.   The type of carbohydrate in the diet is more important than the amount. Some sources of carbohydrates, such as vegetables, fruits, whole grains, and beans-are healthier than others.   Finally, stay active  Signed, Berniece Salines, DO  10/08/2020 10:29 AM    [facility]

## 2020-10-08 NOTE — Patient Instructions (Addendum)
Medication Instructions:  Your physician recommends that you continue on your current medications as directed. Please refer to the Current Medication list given to you today.  *If you need a refill on your cardiac medications before your next appointment, please call your pharmacy*   Lab Work: None ordered today, but may need some before the Cardiac CT.  If so, they will let you know when they call you to schedule your CT. If you will need it, you can just come to the office anytime after 8:00 to have this done:  BMET  If you have labs (blood work) drawn today and your tests are completely normal, you will receive your results only by: Marland Kitchen MyChart Message (if you have MyChart) OR . A paper copy in the mail If you have any lab test that is abnormal or we need to change your treatment, we will call you to review the results.   Testing/Procedures:  Your physician has requested that you have an echocardiogram. Echocardiography is a painless test that uses sound waves to create images of your heart. It provides your doctor with information about the size and shape of your heart and how well your heart's chambers and valves are working. This procedure takes approximately one hour. There are no restrictions for this procedure.   Your physician has requested that you have cardiac CT. Cardiac computed tomography (CT) is a painless test that uses an x-ray machine to take clear, detailed pictures of your heart. For further information please visit [email or web address]. Please follow instruction sheet BELOW:  Your cardiac CT will be scheduled at   [facility], [place] [address] [phone]   Please arrive at the [facility] main entrance of [facility] 30 minutes prior to test start time. Proceed to the [facility] Radiology Department (first floor) to check-in and test prep.   Please follow these instructions carefully (unless otherwise  directed):  Hold all erectile dysfunction medications at least 3 days (72 hrs) prior to test.  On the Night Before the Test: . Be sure to Drink plenty of water. . Do not consume any caffeinated/decaffeinated beverages or chocolate 12 hours prior to your test. . Do not take any antihistamines 12 hours prior to your test.   On the Day of the Test: . Drink plenty of water. Do not drink any water within one hour of the test. . Do not eat any food 4 hours prior to the test. . You may take your regular medications prior to the test.  . Take metoprolol (Lopressor) 100 mg two hours prior to test. (this has been sent to your pharmacy)        After the Test: . Drink plenty of water. . After receiving IV contrast, you may experience a mild flushed feeling. This is normal. . On occasion, you may experience a mild rash up to 24 hours after the test. This is not dangerous. If this occurs, you can take Benadryl 25 mg and increase your fluid intake. . If you experience trouble breathing, this can be serious. If it is severe call 911 IMMEDIATELY. If it is mild, please call our office. . If you take any of these medications: Glipizide/Metformin, Avandament, Glucavance, please do not take 48 hours after completing test unless otherwise instructed.   Once we have confirmed authorization from your insurance company, we will call you to set up a date and time for your test. Based on how quickly your insurance processes  prior authorizations requests, please allow up to 4 weeks to be contacted for scheduling your Cardiac CT appointment. Be advised that routine Cardiac CT appointments could be scheduled as many as 8 weeks after your provider has ordered it.  For non-scheduling related questions, please contact the cardiac imaging nurse navigator should you have any questions/concerns: Marchia Bond, Cardiac Imaging Nurse Navigator Burley Saver, Interim Cardiac Imaging Nurse [place] [facility]  [facility] Direct Office Dial: [phone]   For scheduling needs, including cancellations and rescheduling, please call [place], [phone].      ZIO AT Long term monitor-Live Telemetry  Your physician has requested you wear a ZIO patch monitor for 14 days.  This is a single patch monitor. [employer] supplies one patch monitor per enrollment. Additional stickers are not available.  Please do not apply patch if you will be having a Nuclear Stress Test, Echocardiogram, Cardiac CT, MRI, or Chest Xray during the time frame you would be wearing the monitor. The patch cannot be worn during these tests. You cannot remove and re-apply the ZIO AT patch monitor.   Your ZIO patch monitor will be sent Fed Ex from [employer] directly to your home address. The monitor may also be mailed to a PO BOX if home delivery is not available. It may take 3-5 days to receive your monitor after you have been enrolled.  Once you have received you monitor, please review enclosed instructions. Your monitor has already been registered assigning a specific monitor serial # to you.   Applying the monitor  Shave hair from upper left chest.  Hold abrader disc by orange tab. Rub abrader in 40 strokes over left upper chest as indicated in your monitor instructions.  Clean area with 4 enclosed alcohol pads. Use all pads to ensure the area is cleaned thoroughly. Let dry.  Apply patch as indicated in monitor instructions. Patch will be placed under collarbone on left side of chest with arrow pointing upward.  Rub patch adhesive wings for 2 minutes. Remove the white label marked "1". Remove the white label marked "2". Rub patch adhesive wings for 2 additional minutes.  While looking in a mirror, press and release button in center of patch. A small green light will flash 3-4 times. This will be your only indicator the monitor has been turned on.  Do not shower for the first 24 hours. You may shower after the first 24  hours.  Press the button if you feel a symptom. You will hear a small click. Record Date, Time and Symptom in the Patient Log.   Starting the Gateway  In your kit there is a Hydrographic surveyor box the size of a cellphone. This is Airline pilot. It transmits all your recorded data to [facility]. This box must stay within 10 feet of you at all times. Open the box and push the * button. There will be a light that blinks orange and then green a few times. When the light stops blinking, the Gateway is connected to the ZIO patch.  Call [employer] at [phone] to confirm your monitor is transmitting.   Returning your monitor  Remove your patch and place it inside the [place]. In the lower half of the Gateway there is a white bag with prepaid postage on it. Place Gateway in bag and seal. Mail package back to [place] as soon as possible. Your physician should have your final report approximately 7 days after you have mailed back your monitor.   Call [employer] Customer Care at [phone] if you have questions regarding your ZIO AT patch monitor. Call them immediately if you see an orange light blinking on your monitor.  If your monitor falls off in less than 4 days contact our Monitor department at [phone]. If your monitor becomes loose or falls off after 4 days call [employer] at [phone] for suggestions on securing your monitor.        Follow-Up: At [facility], you and your health needs are our priority.  As part of our continuing mission to provide you with exceptional heart care, we have created designated Provider Care Teams.  These Care Teams include your primary Cardiologist (physician) and Advanced Practice Providers (APPs -  Physician Assistants and Nurse Practitioners) who all work together to provide you with the care you need, when you need it.  We recommend signing up for the patient portal called "MyChart".  Sign up information is provided on this After Visit Summary.   MyChart is used to connect with patients for Virtual Visits (Telemedicine).  Patients are able to view lab/test results, encounter notes, upcoming appointments, etc.  Non-urgent messages can be sent to your provider as well.   To learn more about what you can do with MyChart, go to [email or web address].    Your next appointment:   12 week(s)  The format for your next appointment:   In Person  Provider:   Berniece Salines, DO   Other Instructions  Echocardiogram An echocardiogram is a test that uses sound waves (ultrasound) to produce images of the heart. Images from an echocardiogram can provide important information about:  Heart size and shape.  The size and thickness and movement of your heart's walls.  Heart muscle function and strength.  Heart valve function or if you have stenosis. Stenosis is when the heart valves are too narrow.  If blood is flowing backward through the heart valves (regurgitation).  A tumor or infectious growth around the heart valves.  Areas of heart muscle that are not working well because of poor blood flow or injury from a heart attack.  Aneurysm detection. An aneurysm is a weak or damaged part of an artery wall. The wall bulges out from the normal force of blood pumping through the body. Tell a health care provider about:  Any allergies you have.  All medicines you are taking, including vitamins, herbs, eye drops, creams, and over-the-counter medicines.  Any blood disorders you have.  Any surgeries you have had.  Any medical conditions you have.  Whether you are pregnant or may be pregnant. What are the risks? Generally, this is a safe test. However, problems may occur, including an allergic reaction to dye (contrast) that may be used during the test. What happens before the test? No specific preparation is needed. You may eat and drink normally. What happens during the test?  You will take off your clothes from the waist up and put on  a hospital gown.  Electrodes or electrocardiogram (ECG)patches may be placed on your chest. The electrodes or patches are then connected to a device that monitors your heart rate and rhythm.  You will lie down on a table for an ultrasound exam. A gel will be applied to your chest to help sound waves pass through your skin.  A handheld device, called a transducer, will be pressed against your chest and moved over your heart. The transducer produces sound waves that travel to your heart and bounce back (or "echo" back) to the transducer.  These sound waves will be captured in real-time and changed into images of your heart that can be viewed on a video monitor. The images will be recorded on a computer and reviewed by your health care provider.  You may be asked to change positions or hold your breath for a short time. This makes it easier to get different views or better views of your heart.  In some cases, you may receive contrast through an IV in one of your veins. This can improve the quality of the pictures from your heart. The procedure may vary among health care providers and hospitals.   What can I expect after the test? You may return to your normal, everyday life, including diet, activities, and medicines, unless your health care provider tells you not to do that. Follow these instructions at home:  It is up to you to get the results of your test. Ask your health care provider, or the department that is doing the test, when your results will be ready.  Keep all follow-up visits. This is important. Summary  An echocardiogram is a test that uses sound waves (ultrasound) to produce images of the heart.  Images from an echocardiogram can provide important information about the size and shape of your heart, heart muscle function, heart valve function, and other possible heart problems.  You do not need to do anything to prepare before this test. You may eat and drink normally.  After the  echocardiogram is completed, you may return to your normal, everyday life, unless your health care provider tells you not to do that. This information is not intended to replace advice given to you by your health care provider. Make sure you discuss any questions you have with your health care provider. Document Revised: 04/29/2020 Document Reviewed: 04/29/2020 Elsevier Patient Education  2021 [employer].

## 2020-10-15 MED FILL — METOPROLOL TARTRATE 100 MG: 100 | 1 days supply | Qty: 1 | Fill #0

## 2020-10-31 ENCOUNTER — Ambulatory Visit (HOSPITAL_BASED_OUTPATIENT_CLINIC_OR_DEPARTMENT_OTHER)
Admission: RE | Admit: 2020-10-31 | Discharge: 2020-10-31 | Disposition: A | Discharge status: Home / Self Care | Payer: BC Managed Care – PPO | Source: Ambulatory Visit | Attending: Cardiology | Admitting: Cardiology

## 2020-10-31 ENCOUNTER — Telehealth (HOSPITAL_COMMUNITY): Payer: Self-pay | Admitting: Emergency Medicine

## 2020-10-31 ENCOUNTER — Other Ambulatory Visit: Payer: Self-pay

## 2020-10-31 DIAGNOSIS — R079 Chest pain, unspecified: Secondary | ICD-10-CM | POA: Diagnosis present

## 2020-10-31 DIAGNOSIS — R072 Precordial pain: Secondary | ICD-10-CM | POA: Insufficient documentation

## 2020-10-31 DIAGNOSIS — R55 Syncope and collapse: Secondary | ICD-10-CM | POA: Diagnosis not present

## 2020-10-31 LAB — ECHOCARDIOGRAM COMPLETE
Area-P 1/2: 4.29 cm2
S' Lateral: 3.2 cm

## 2020-10-31 NOTE — Telephone Encounter (Signed)
Attempted to call patient regarding upcoming cardiac CT appointment. °Left message on voicemail with name and callback number °Vedika Dumlao RN Navigator Cardiac Imaging ° [facility] Services °[phone] Office °[phone] Cell ° °

## 2020-11-03 ENCOUNTER — Other Ambulatory Visit (HOSPITAL_BASED_OUTPATIENT_CLINIC_OR_DEPARTMENT_OTHER): Payer: Self-pay | Admitting: Family Medicine

## 2020-11-03 ENCOUNTER — Telehealth (HOSPITAL_COMMUNITY): Payer: Self-pay | Admitting: Emergency Medicine

## 2020-11-03 NOTE — Telephone Encounter (Signed)
Pt returning phone call regarding upcoming cardiac imaging study; pt verbalizes understanding of appt date/time, parking situation and where to check in, pre-test NPO status and medications ordered, and verified current allergies; name and call back number provided for further questions should they arise Marchia Bond RN Navigator Cardiac Imaging [facility] [phone] office [phone] cell   100mg  metoprolol tartrate 2 hr prior to scan Pt reports some 'white coat' related to OV HR being in the 90s. States resting HR typically in the 50-60s. I asked him to check HR at 7am and if HR > 60bpm to continue to take 100mg  metoprolol. But not to take if <60bpm. Pt verbalized understanding. Pt also holding adderall  Clarise Cruz

## 2020-11-03 NOTE — Telephone Encounter (Signed)
Attempted to call patient regarding upcoming cardiac CT appointment. °Left message on voicemail with name and callback number °Elenie Coven RN Navigator Cardiac Imaging °[place] [facility] °[phone] Office °[phone] Cell ° °

## 2020-11-04 ENCOUNTER — Ambulatory Visit (HOSPITAL_COMMUNITY)
Admission: RE | Admit: 2020-11-04 | Discharge: 2020-11-04 | Disposition: A | Discharge status: Home / Self Care | Payer: BC Managed Care – PPO | Source: Ambulatory Visit | Attending: Cardiology | Admitting: Cardiology

## 2020-11-04 ENCOUNTER — Other Ambulatory Visit: Payer: Self-pay

## 2020-11-04 DIAGNOSIS — R072 Precordial pain: Secondary | ICD-10-CM

## 2020-11-04 DIAGNOSIS — Z006 Encounter for examination for normal comparison and control in clinical research program: Secondary | ICD-10-CM

## 2020-11-04 MED ORDER — IOHEXOL 350 MG/ML SOLN
80.0000 mL | Freq: Once | INTRAVENOUS | Status: AC | PRN
  Administered 2020-11-04: 80 mL via INTRAVENOUS

## 2020-11-04 MED ORDER — NITROGLYCERIN 0.4 MG SL SUBL: 0.8000 mg | SUBLINGUAL_TABLET | Freq: Once | SUBLINGUAL | Status: AC

## 2020-11-04 MED ORDER — NITROGLYCERIN 0.4 MG SL SUBL
SUBLINGUAL_TABLET | SUBLINGUAL | Status: AC
  Administered 2020-11-04: 0.8 mg via SUBLINGUAL
  Filled 2020-11-04: qty 2

## 2020-11-04 NOTE — Research (Signed)
IDENTIFY Informed Consent                  Subject Name: Keith Wright    Subject met inclusion and exclusion criteria.  The informed consent form, study requirements and expectations were reviewed with the subject and questions and concerns were addressed prior to the signing of the consent form.  The subject verbalized understanding of the trial requirements.  The subject agreed to participate in the IDENTIFY trial and signed the informed consent at 08:18AM on 11/04/20.  The informed consent was obtained prior to performance of any protocol-specific procedures for the subject.  A copy of the signed informed consent was given to the subject and a copy was placed in the subject's medical record.   Meade Maw, Naval architect

## 2020-11-07 ENCOUNTER — Telehealth: Payer: Self-pay | Admitting: Cardiology

## 2020-11-07 ENCOUNTER — Encounter: Payer: Self-pay | Admitting: Cardiology

## 2020-11-07 ENCOUNTER — Other Ambulatory Visit: Payer: Self-pay

## 2020-11-07 ENCOUNTER — Ambulatory Visit (INDEPENDENT_AMBULATORY_CARE_PROVIDER_SITE_OTHER): Payer: BC Managed Care – PPO | Admitting: Cardiology

## 2020-11-07 ENCOUNTER — Other Ambulatory Visit: Payer: Self-pay | Admitting: Cardiology

## 2020-11-07 VITALS — BP 110/80 | HR 82 | Ht >= 80 in | Wt 222.0 lb

## 2020-11-07 DIAGNOSIS — I4729 Other ventricular tachycardia: Secondary | ICD-10-CM

## 2020-11-07 DIAGNOSIS — R55 Syncope and collapse: Secondary | ICD-10-CM

## 2020-11-07 DIAGNOSIS — I472 Ventricular tachycardia: Secondary | ICD-10-CM

## 2020-11-07 MED ORDER — DILTIAZEM HCL 120 MG PO TABS: 120.0000 mg | ORAL_TABLET | Freq: Every day | ORAL | 3 refills | Status: DC

## 2020-11-07 MED FILL — AMPHETAMINE-DEXTROAMPHETAMI: 10 | 30 days supply | Qty: 60 | Fill #0

## 2020-11-07 MED FILL — dilTIAZem HCL 120 MG TABS: 120 | 30 days supply | Qty: 30 | Fill #0

## 2020-11-07 NOTE — Telephone Encounter (Signed)
Left message for patient to call and discuss scheduling the Cardiac MRI ordered by Dr, Harriet Masson

## 2020-11-07 NOTE — Patient Instructions (Signed)
Medication Instructions:  Your physician has recommended you make the following change in your medication: START: Cardizem 120 mg daily.  *If you need a refill on your cardiac medications before your next appointment, please call your pharmacy*   Lab Work: Your physician recommends that you return for lab work: TODAY Familial Arrhythmia If you have labs (blood work) drawn today and your tests are completely normal, you will receive your results only by: Marland Kitchen MyChart Message (if you have MyChart) OR . A paper copy in the mail If you have any lab test that is abnormal or we need to change your treatment, we will call you to review the results.   Testing/Procedures: You are scheduled for Cardiac MRI. Please arrive at the [facility] main entrance of [facility] at (30-45 minutes prior to test start time). ?   [facility]  [address]  [place], [place] [address]  [phone]  Proceed to the [facility] Radiology Department (First Floor).  ?  Magnetic resonance imaging (MRI) is a painless test that produces images of the inside of the body without using X-rays. During an MRI, strong magnets and radio waves work together in a Research officer, political party to form detailed images. MRI images may provide more details about a medical condition than X-rays, CT scans, and ultrasounds can provide.  You may be given earphones to listen for instructions.  You may eat a light breakfast and take medications as ordered. If a contrast material will be used, an IV will be inserted into one of your veins. Contrast material will be injected into your IV.  You will be asked to remove all metal, including: Watch, jewelry, and other metal objects including hearing aids, hair pieces and dentures. (Braces and fillings normally are not a problem.)  If contrast material was used:  It will leave your body through your urine within a day. You may be told to drink plenty of fluids to help flush the contrast  material out of your system.  TEST WILL TAKE APPROXIMATELY 1 HOUR  PLEASE NOTIFY SCHEDULING AT LEAST 24 HOURS IN ADVANCE IF YOU ARE UNABLE TO KEEP YOUR APPOINTMENT.     Follow-Up: At [facility], you and your health needs are our priority.  As part of our continuing mission to provide you with exceptional heart care, we have created designated Provider Care Teams.  These Care Teams include your primary Cardiologist (physician) and Advanced Practice Providers (APPs -  Physician Assistants and Nurse Practitioners) who all work together to provide you with the care you need, when you need it.  We recommend signing up for the patient portal called "MyChart".  Sign up information is provided on this After Visit Summary.  MyChart is used to connect with patients for Virtual Visits (Telemedicine).  Patients are able to view lab/test results, encounter notes, upcoming appointments, etc.  Non-urgent messages can be sent to your provider as well.   To learn more about what you can do with MyChart, go to [email or web address].    Your next appointment:   8 week(s)  The format for your next appointment:   In Person  Provider:   Berniece Salines, DO   Other Instructions

## 2020-11-07 NOTE — Progress Notes (Signed)
Cardiology Office Note:    Date:  11/07/2020   ID:  Keith Wright, DOB 19-Oct-1980, MRN [id number]  PCP:  Bartholome Bill, MD  Cardiologist:  Berniece Salines, DO  Electrophysiologist:  None   Referring MD: Bartholome Bill, MD   Chief Complaint  Patient presents with  . Follow-up   History of Present Illness:    Keith Wright is a 40 y.o. male with a hx of hx of syncope at age 39 when he was playing basketball and he passed out. During that time he was worked he had a stress test as well as an echocardiogram he was told he has LVH and he was educated that this could have been affects heart.  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 He said he felt sweaty and as he tried to keep himself together he passed out. He is not sure how long he was out for.  During that time he also reported that he has been experiencing intermittent chest discomfort as well the shortness of breath.  At the conclusion of his visit due to the fact that he has had syncope as well as chest pain recommended he get a ZIO monitor, and echocardiogram for structural abnormalities as well as to rule out ischemic etiologies with coronary CTA.  In the interim the patient was able to get all these testings done he is here today to discuss his test results.  He is here with his wife.  He has not had any syncope episodes.  Of note he reports that he requested to get COVID-19 antibody testing done which was showing very high levels he may have had subclinical COVID-19 infection as his daughter also had COVID-19 infection.  He also notes that he is experiencing significant palpitation mostly at nighttime.  No other complaints at this time.  Past Medical History:  Diagnosis Date  . Elevated platelet  count   . History of chicken pox     Past Surgical History:  Procedure Laterality Date  . WISDOM TOOTH EXTRACTION      Current Medications: Current Meds  Medication Sig  . diltiazem (CARDIZEM) 120 MG tablet Take 1 tablet (120 mg total) by mouth daily.     Allergies:   Patient has no known allergies.   Social History   Socioeconomic History  . Marital status: Married    Spouse name: Not on file  . Number of children: Not on file  . Years of education: Not on file  . Highest education level: Not on file  Occupational History  . Occupation: Pharmacist  Tobacco Use  . Smoking status: Never Smoker  . Smokeless tobacco: Never Used  Substance and Sexual Activity  . Alcohol use: Yes    Alcohol/week: 0.0 standard drinks    Comment: social -- 1 per month  . Drug use: No  . Sexual activity: Yes    Partners: Female  Other Topics Concern  . Not on file  Social History Narrative  . Not on file   Social Determinants of Health   Financial Resource Strain: Not on file  Food Insecurity: Not on file  Transportation Needs: Not on file  Physical Activity: Not on file  Stress: Not on file  Social  Connections: Not on file     Family History: The patient's family history includes Colon cancer (age of onset: 61) in his paternal grandmother; Healthy in his brother, daughter, father, and mother; Heart attack in his maternal grandfather.  ROS:   Review of Systems  Constitution: Negative for decreased appetite, fever and weight gain.  HENT: Negative for congestion, ear discharge, hoarse voice and sore throat.   Eyes: Negative for discharge, redness, vision loss in right eye and visual halos.  Cardiovascular: Negative for chest pain, dyspnea on exertion, leg swelling, orthopnea and palpitations.  Respiratory: Negative for cough, hemoptysis, shortness of breath and snoring.   Endocrine: Negative for heat intolerance and polyphagia.  Hematologic/Lymphatic: Negative for bleeding  problem. Does not bruise/bleed easily.  Skin: Negative for flushing, nail changes, rash and suspicious lesions.  Musculoskeletal: Negative for arthritis, joint pain, muscle cramps, myalgias, neck pain and stiffness.  Gastrointestinal: Negative for abdominal pain, bowel incontinence, diarrhea and excessive appetite.  Genitourinary: Negative for decreased libido, genital sores and incomplete emptying.  Neurological: Negative for brief paralysis, focal weakness, headaches and loss of balance.  Psychiatric/Behavioral: Negative for altered mental status, depression and suicidal ideas.  Allergic/Immunologic: Negative for HIV exposure and persistent infections.    EKGs/Labs/Other Studies Reviewed:    The following studies were reviewed today:   EKG:  The ekg ordered today demonstrates    Zio monitor October 08, 2020 The patient wore the monitor for 13 days 19 hours hours starting starting October 11, 2020. Indication: Syncope  The minimum heart rate was 44 bpm, maximum heart rate was 186 bpm, and average heart rate was 89 bpm. Predominant underlying rhythm was Sinus Rhythm.  1 run of Ventricular Tachycardia occurred lasting 12 beats with a maximum rate of 171bpm (average 156 bpm).  Premature atrial complexes were rare, less than 1%. Premature Ventricular complexes were rare, less than 1%.  No pauses, No AV block, no supraventricular tachycardia and no atrial fibrillation present.  44 patient triggered events mostly associated with sinus tachycardia.  Conclusion: This study is remarkable for nonsustained ventricular tachycardia   Transthoracic echocardiogram IMPRESSIONS  1. Left ventricular ejection fraction, by estimation, is 60 to 65%. The left ventricle has normal function. The left ventricle has no regional wall motion abnormalities. There is mild concentric left ventricular hypertrophy. Left ventricular diastolic  parameters were normal.  2. Right ventricular systolic  function is normal. The right ventricular size is normal.  3. The mitral valve is normal in structure. No evidence of mitral valve regurgitation. No evidence of mitral stenosis.  4. The aortic valve is normal in structure. Aortic valve regurgitation is trivial. No aortic stenosis is present.  5. The inferior vena cava is normal in size with greater than 50% respiratory variability, suggesting right atrial pressure of 3 mmHg.   FINDINGS  Left Ventricle: Left ventricular ejection fraction, by estimation, is 60 to 65%. The left ventricle has normal function. The left ventricle has no regional wall motion abnormalities. The left ventricular internal cavity size was normal in size. There is mild concentric left ventricular hypertrophy. Left ventricular diastolic parameters were normal.   Right Ventricle: The right ventricular size is normal. No increase in right ventricular wall thickness. Right ventricular systolic function is normal.   Left Atrium: Left atrial size was normal in size.   Right Atrium: Right atrial size was normal in size.   Pericardium: There is no evidence of pericardial effusion.   Mitral Valve: The mitral valve is normal in structure. No  evidence of mitral valve regurgitation. No evidence of mitral valve stenosis.   Tricuspid Valve: The tricuspid valve is normal in structure. Tricuspid valve regurgitation is mild . No evidence of tricuspid stenosis.   Aortic Valve: The aortic valve is normal in structure. Aortic valve regurgitation is trivial. No aortic stenosis is present.   Pulmonic Valve: The pulmonic valve was normal in structure. Pulmonic valve regurgitation is not visualized. No evidence of pulmonic stenosis.   Aorta: The aortic root is normal in size and structure.   Venous: The inferior vena cava is normal in size with greater than 50% respiratory variability, suggesting right atrial pressure of 3 mmHg.   IAS/Shunts: No atrial level shunt detected by color  flow Doppler.      Coronary CTA Aorta: Normal size.  No calcifications.  No dissection.  Aortic Valve:  Trileaflet.  No calcifications.  Coronary Arteries:  Normal coronary origin.  left dominance.  RCA is a small non-dominant artery with no plaque.  Left main is a large artery that gives rise to LAD and LCX arteries.  LAD is a large vessel that has no plaque.  LCX is a dominant artery that gives rise to one large OM1 branch and that gives rise to PDA. There is no plaque.  Other findings:  Normal pulmonary vein drainage into the left atrium.  Normal left atrial appendage without a thrombus.  Normal size of the pulmonary artery.  IMPRESSION: 1. Coronary calcium score of 0. This was 0 percentile for age and sex matched control.  2. Normal coronary origin with left dominance.  3. No evidence of CAD.   Recent Labs: 09/30/2020: BUN 14; Creatinine, Ser 0.97; Hemoglobin 12.7; Platelets 316; Potassium 4.7; Sodium 134  Recent Lipid Panel    Component Value Date/Time   CHOL 177 08/13/2015 0759   TRIG 55.0 08/13/2015 0759   HDL 46.90 08/13/2015 0759   CHOLHDL 4 08/13/2015 0759   VLDL 11.0 08/13/2015 0759   LDLCALC 119 (H) 08/13/2015 0759    Physical Exam:    VS:  BP 110/80 (BP Location: Right Arm, Patient Position: Sitting, Cuff Size: Normal)   Pulse 82   Ht 6\' 8"  (2.032 m)   Wt 222 lb (100.7 kg)   SpO2 99%   BMI 24.39 kg/m     Wt Readings from Last 3 Encounters:  11/07/20 222 lb (100.7 kg)  10/08/20 214 lb (97.1 kg)  09/30/20 215 lb (97.5 kg)     GEN: Well nourished, well developed in no acute distress HEENT: Normal NECK: No JVD; No carotid bruits LYMPHATICS: No lymphadenopathy CARDIAC: S1S2 noted,RRR, no murmurs, rubs, gallops RESPIRATORY:  Clear to auscultation without rales, wheezing or rhonchi  ABDOMEN: Soft, non-tender, non-distended, +bowel sounds, no guarding. EXTREMITIES: No edema, No cyanosis, no clubbing MUSCULOSKELETAL:  No  deformity  SKIN: Warm and dry NEUROLOGIC:  Alert and oriented x 3, non-focal PSYCHIATRIC:  Normal affect, good insight  ASSESSMENT:    1. NSVT (nonsustained ventricular tachycardia) ([place])   2. Syncope and collapse    PLAN:     His event monitor did show evidence of 12 beats of nonsustained ventricular tachycardia.  This was asymptomatic.  In the setting of his recent syncope episode we will like to do is have the patient be evaluated by EP.  In the meantime I am going to start him on Cardizem 120 mg daily as he tells me he is experiencing significant palpitations at nighttime.  I am hoping this will help with his symptoms.  In addition his coronary CTA did not show any evidence of coronary artery disease and his echocardiogram did not show any evidence of structural abnormality that could be affecting this patient at this time.  We will like to proceed with while he waits to see EP as scheduled cardiac MRI to rule out any infiltrative diseases and also be able to take a look at his right heart, he get neck sequence familiar arrhythmia genic blood work to understand more.  I talked to the patient and his wife they are both in agreement with the plan as stated above.  The patient is in agreement with the above plan. The patient left the office in stable condition.  The patient will follow up in 8 weeks or sooner if needed.   Medication Adjustments/Labs and Tests Ordered: Current medicines are reviewed at length with the patient today.  Concerns regarding medicines are outlined above.  Orders Placed This Encounter  Procedures  . MR Card Morphology Wo/W Cm  . GeneSeq: Familial Arrhythmia  . Ambulatory referral to Cardiac Electrophysiology   Meds ordered this encounter  Medications  . diltiazem (CARDIZEM) 120 MG tablet    Sig: Take 1 tablet (120 mg total) by mouth daily.    Dispense:  90 tablet    Refill:  3    Patient Instructions  Medication Instructions:  Your physician has  recommended you make the following change in your medication: START: Cardizem 120 mg daily.  *If you need a refill on your cardiac medications before your next appointment, please call your pharmacy*   Lab Work: Your physician recommends that you return for lab work: TODAY Familial Arrhythmia If you have labs (blood work) drawn today and your tests are completely normal, you will receive your results only by: Marland Kitchen MyChart Message (if you have MyChart) OR . A paper copy in the mail If you have any lab test that is abnormal or we need to change your treatment, we will call you to review the results.   Testing/Procedures: You are scheduled for Cardiac MRI. Please arrive at the [facility] main entrance of [facility] at (30-45 minutes prior to test start time). ?   [facility]  [address]  [place], [place] [address]  [phone]  Proceed to the [facility] Radiology Department (First Floor).  ?  Magnetic resonance imaging (MRI) is a painless test that produces images of the inside of the body without using X-rays. During an MRI, strong magnets and radio waves work together in a Research officer, political party to form detailed images. MRI images may provide more details about a medical condition than X-rays, CT scans, and ultrasounds can provide.  You may be given earphones to listen for instructions.  You may eat a light breakfast and take medications as ordered. If a contrast material will be used, an IV will be inserted into one of your veins. Contrast material will be injected into your IV.  You will be asked to remove all metal, including: Watch, jewelry, and other metal objects including hearing aids, hair pieces and dentures. (Braces and fillings normally are not a problem.)  If contrast material was used:  It will leave your body through your urine within a day. You may be told to drink plenty of fluids to help flush the contrast material out of your system.  TEST WILL TAKE  APPROXIMATELY 1 HOUR  PLEASE NOTIFY SCHEDULING AT LEAST 24 HOURS IN ADVANCE IF YOU ARE UNABLE TO KEEP YOUR  APPOINTMENT.     Follow-Up: At [facility], you and your health needs are our priority.  As part of our continuing mission to provide you with exceptional heart care, we have created designated Provider Care Teams.  These Care Teams include your primary Cardiologist (physician) and Advanced Practice Providers (APPs -  Physician Assistants and Nurse Practitioners) who all work together to provide you with the care you need, when you need it.  We recommend signing up for the patient portal called "MyChart".  Sign up information is provided on this After Visit Summary.  MyChart is used to connect with patients for Virtual Visits (Telemedicine).  Patients are able to view lab/test results, encounter notes, upcoming appointments, etc.  Non-urgent messages can be sent to your provider as well.   To learn more about what you can do with MyChart, go to [email or web address].    Your next appointment:   8 week(s)  The format for your next appointment:   In Person  Provider:   Berniece Salines, DO   Other Instructions      Adopting a Healthy Lifestyle.  Know what a healthy weight is for you (roughly BMI <25) and aim to maintain this   Aim for 7+ servings of fruits and vegetables daily   65-80+ fluid ounces of water or unsweet tea for healthy kidneys   Limit to max 1 drink of alcohol per day; avoid smoking/tobacco   Limit animal fats in diet for cholesterol and heart health - choose grass fed whenever available   Avoid highly processed foods, and foods high in saturated/trans fats   Aim for low stress - take time to unwind and care for your mental health   Aim for 150 min of moderate intensity exercise weekly for heart health, and weights twice weekly for bone health   Aim for 7-9 hours of sleep daily   When it comes to diets, agreement about the perfect plan isnt easy to  find, even among the experts. Experts at the [employer] developed an idea known as the Healthy Eating Plate. Just imagine a plate divided into logical, healthy portions.   The emphasis is on diet quality:   Load up on vegetables and fruits - one-half of your plate: Aim for color and variety, and remember that potatoes dont count.   Go for whole grains - one-quarter of your plate: Whole wheat, barley, wheat berries, quinoa, oats, brown rice, and foods made with them. If you want pasta, go with whole wheat pasta.   Protein power - one-quarter of your plate: Fish, chicken, beans, and nuts are all healthy, versatile protein sources. Limit red meat.   The diet, however, does go beyond the plate, offering a few other suggestions.   Use healthy plant oils, such as olive, canola, soy, corn, sunflower and peanut. Check the labels, and avoid partially hydrogenated oil, which have unhealthy trans fats.   If youre thirsty, drink water. Coffee and tea are good in moderation, but skip sugary drinks and limit milk and dairy products to one or two daily servings.   The type of carbohydrate in the diet is more important than the amount. Some sources of carbohydrates, such as vegetables, fruits, whole grains, and beans-are healthier than others.   Finally, stay active  Signed, Berniece Salines, DO  11/07/2020 2:15 PM    [facility]

## 2020-11-10 NOTE — Telephone Encounter (Signed)
Left message for patient to call and discuss scheduling the Cardiac MRI ordered by Berniece Salines, DO

## 2020-11-13 ENCOUNTER — Encounter: Payer: Self-pay | Admitting: Cardiology

## 2020-11-13 NOTE — Telephone Encounter (Signed)
Spoke with patient regarding 12/08/20 Cardiac MRI scheduled at 8:00 am at [facility]---arrival time is 7:45 am 1st floor admissions office for check in---will mail information to patient and it is available in My Chart.  Patient voiced his understanding.Marland Kitchen

## 2020-11-25 LAB — GENESEQ: FAMILIAL ARRHYTHMIA

## 2020-12-05 ENCOUNTER — Telehealth (HOSPITAL_COMMUNITY): Payer: Self-pay | Admitting: Emergency Medicine

## 2020-12-05 NOTE — Telephone Encounter (Signed)
Attempted to call patient regarding upcoming cardiac MR appointment. Left message on voicemail with name and callback number Ahnya Akre RN Navigator Cardiac Imaging [facility] [phone] Office [phone] Cell  

## 2020-12-08 ENCOUNTER — Other Ambulatory Visit: Payer: Self-pay

## 2020-12-08 ENCOUNTER — Ambulatory Visit (HOSPITAL_COMMUNITY)
Admission: RE | Admit: 2020-12-08 | Discharge: 2020-12-08 | Disposition: A | Discharge status: Home / Self Care | Payer: BC Managed Care – PPO | Source: Ambulatory Visit | Attending: Cardiology | Admitting: Cardiology

## 2020-12-08 DIAGNOSIS — I472 Ventricular tachycardia: Secondary | ICD-10-CM | POA: Insufficient documentation

## 2020-12-08 DIAGNOSIS — I4729 Other ventricular tachycardia: Secondary | ICD-10-CM

## 2020-12-08 MED ORDER — GADOBUTROL 1 MMOL/ML IV SOLN
10.0000 mL | Freq: Once | INTRAVENOUS | Status: AC | PRN
  Administered 2020-12-08: 10 mL via INTRAVENOUS

## 2020-12-12 ENCOUNTER — Telehealth: Payer: Self-pay | Admitting: Cardiology

## 2020-12-12 ENCOUNTER — Other Ambulatory Visit: Payer: Self-pay

## 2020-12-12 MED ORDER — COLCHICINE 0.6 MG PO TABS: 0.6000 mg | ORAL_TABLET | Freq: Every day | ORAL | 0 refills | Status: DC

## 2020-12-12 MED FILL — COLCHICINE 0.6 MG TABS: 0.6 | 30 days supply | Qty: 30 | Fill #0

## 2020-12-12 NOTE — Telephone Encounter (Signed)
I called the patient to speak with him about his cardiac MRI result. 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 We discussed treatment plan.  The patient is agreeable to start colchicine 0.6 mg twice daily-we plan for a 90-day course. He also is going to be very careful about his exercises as he already has been cautious. He has a planned visit with EP March 28 and he will follow up with me on April 18.

## 2020-12-12 NOTE — Telephone Encounter (Signed)
Patient returning a call from the office

## 2020-12-12 NOTE — Telephone Encounter (Signed)
     Pt said Dr. Harriet Masson called him yesterday for his mri result. He is returning her call

## 2020-12-12 NOTE — Progress Notes (Signed)
Prescription sent to pharmacy.

## 2020-12-15 ENCOUNTER — Other Ambulatory Visit: Payer: Self-pay

## 2020-12-15 ENCOUNTER — Other Ambulatory Visit: Payer: Self-pay | Admitting: Cardiology

## 2020-12-15 ENCOUNTER — Ambulatory Visit (INDEPENDENT_AMBULATORY_CARE_PROVIDER_SITE_OTHER): Payer: BC Managed Care – PPO | Admitting: Cardiology

## 2020-12-15 ENCOUNTER — Encounter: Payer: Self-pay | Admitting: Cardiology

## 2020-12-15 VITALS — BP 120/80 | HR 82 | Ht 79.0 in | Wt 227.0 lb

## 2020-12-15 DIAGNOSIS — R55 Syncope and collapse: Secondary | ICD-10-CM

## 2020-12-15 DIAGNOSIS — I472 Ventricular tachycardia: Secondary | ICD-10-CM

## 2020-12-15 DIAGNOSIS — I4729 Other ventricular tachycardia: Secondary | ICD-10-CM

## 2020-12-15 MED ORDER — METOPROLOL SUCCINATE ER 100 MG PO TB24
100.0000 mg | ORAL_TABLET | Freq: Every day | ORAL | 3 refills | Status: DC
Start: 2020-12-15 — End: 2020-12-15

## 2020-12-15 MED FILL — METOPROLOL SUCCINATE ER 100: 100 | 30 days supply | Qty: 30 | Fill #0

## 2020-12-15 NOTE — Progress Notes (Signed)
Electrophysiology Office Note   Date:  12/15/2020   ID:  Rhet, Rorke 04-17-1981, MRN [id number]  PCP:  Bartholome Bill, MD  Cardiologist:  Tobb Primary Electrophysiologist:  Will Meredith Leeds, MD    Chief Complaint: NSVT   History of Present Illness: Keith Wright is a 40 y.o. male who is being seen today for the evaluation of syncope at the request of Tobb, Kardie, DO. Presenting today for electrophysiology evaluation.  He presents for work-up of syncope.  At age 12, he had a syncopal episode while playing basketball.  He had a stress test as well as echo at that time that showed LVH.  He was seen in cardiology clinic January 2022 after an episode of near syncope at a pediatrician's office with his daughter.  He was checking his daughter in and had an acute episode of syncope.  He felt sweaty prior to the episode.  He had also been having intermittent chest discomfort as well as shortness of breath.  The patient states that he was on the ground for approximately 19 minutes.  He wore a ZIO monitor that showed sinus tachycardia associated with symptoms of near syncope as well as a 12 beat run of nonsustained ventricular tachycardia.  On the day of his episode, he went to the emergency room and was told that he was having multiple PACs.  Today, he denies symptoms of palpitations, chest pain, shortness of breath, orthopnea, PND, lower extremity edema, claudication, dizziness, presyncope, syncope, bleeding, or neurologic sequela. The patient is tolerating medications without difficulties.    Past Medical History:  Diagnosis Date  . Elevated platelet count   . History of chicken pox    Past Surgical History:  Procedure Laterality Date  . WISDOM TOOTH EXTRACTION       Current Outpatient Medications  Medication Sig Dispense Refill  . amphetamine-dextroamphetamine (ADDERALL) 10 MG tablet Take 1 tablet by mouth 2 (two) times daily.  0  . colchicine 0.6 MG tablet Take 1 tablet  (0.6 mg total) by mouth daily. 180 tablet 0  . diltiazem (CARDIZEM) 120 MG tablet Take 1 tablet (120 mg total) by mouth daily. 90 tablet 3  . metoprolol succinate (TOPROL-XL) 100 MG 24 hr tablet Take 1 tablet (100 mg total) by mouth daily. Take with or immediately following a meal. 30 tablet 3   No current facility-administered medications for this visit.    Allergies:   Patient has no known allergies.   Social History:  The patient  reports that he has never smoked. He has never used smokeless tobacco. He reports current alcohol use. He reports that he does not use drugs.   Family History:  The patient's family history includes Colon cancer (age of onset: 70) in his paternal grandmother; Healthy in his brother, daughter, father, and mother; Heart attack in his maternal grandfather.    ROS:  Please see the history of present illness.   Otherwise, review of systems is positive for none.   All other systems are reviewed and negative.    PHYSICAL EXAM: VS:  BP 120/80   Pulse 82   Ht 6\' 7"  (2.007 m)   Wt 227 lb (103 kg)   BMI 25.57 kg/m  , BMI Body mass index is 25.57 kg/m. GEN: Well nourished, well developed, in no acute distress  HEENT: normal  Neck: no JVD, carotid bruits, or masses Cardiac: RRR; no murmurs, rubs, or gallops,no edema  Respiratory:  clear to auscultation bilaterally, normal work  of breathing GI: soft, nontender, nondistended, + BS MS: no deformity or atrophy  Skin: warm and dry Neuro:  Strength and sensation are intact Psych: euthymic mood, full affect  EKG:  EKG is ordered today. Personal review of the ekg ordered shows sinus rhythm, rate 82  Recent Labs: 09/30/2020: BUN 14; Creatinine, Ser 0.97; Hemoglobin 12.7; Platelets 316; Potassium 4.7; Sodium 134    Lipid Panel     Component Value Date/Time   CHOL 177 08/13/2015 0759   TRIG 55.0 08/13/2015 0759   HDL 46.90 08/13/2015 0759   CHOLHDL 4 08/13/2015 0759   VLDL 11.0 08/13/2015 0759   LDLCALC 119 (H)  08/13/2015 0759     Wt Readings from Last 3 Encounters:  12/15/20 227 lb (103 kg)  11/07/20 222 lb (100.7 kg)  10/08/20 214 lb (97.1 kg)      Other studies Reviewed: Additional studies/ records that were reviewed today include: TTE 10/31/20  Review of the above records today demonstrates:  1. Left ventricular ejection fraction, by estimation, is 60 to 65%. The  left ventricle has normal function. The left ventricle has no regional  wall motion abnormalities. There is mild concentric left ventricular  hypertrophy. Left ventricular diastolic  parameters were normal.  2. Right ventricular systolic function is normal. The right ventricular  size is normal.  3. The mitral valve is normal in structure. No evidence of mitral valve  regurgitation. No evidence of mitral stenosis.  4. The aortic valve is normal in structure. Aortic valve regurgitation is  trivial. No aortic stenosis is present.  5. The inferior vena cava is normal in size with greater than 50%  respiratory variability, suggesting right atrial pressure of 3 mmHg.   Monitor 11/05/20 personally reviewed The patient wore the monitor for 13 days 19 hours hours starting starting October 11, 2020. Indication: Syncope  The minimum heart rate was 44 bpm, maximum heart rate was 186 bpm, and average heart rate was 89 bpm. Predominant underlying rhythm was Sinus Rhythm.  1 run of Ventricular Tachycardia occurred lasting 12 beats with a maximum rate of 171bpm (average 156 bpm).  Premature atrial complexes were rare, less than 1%. Premature Ventricular complexes were rare, less than 1%.  No pauses, No AV block, no supraventricular tachycardia and no atrial fibrillation present.  44 patient triggered events mostly associated with sinus tachycardia.  Conclusion: This study is remarkable for nonsustained ventricular tachycardia.  Coronary CTA 11/04/2020 1. Coronary calcium score of 0. This was 0 percentile for age  and sex matched control.  2. Normal coronary origin with left dominance.  3. No evidence of CAD  Cardiac MRI 12/09/2020 1. Normal left ventricular size and function, LVEF 53%. Mild LVH. Mild hypokinesis of inferoapical segment.  2. Normal right ventricular size by indexed volume. RVEF 44%, suggests borderline reduced systolic function. Visually, RV function appears normal.  3. There is post contrast delayed myocardial enhancement in one segment of LV apex. The epicardium and midmyocardium of the inferoapex demonstrate delayed myocardial enhancement with no definite myocardial edema. Findings most consistent with focal Myocarditis.  ASSESSMENT AND PLAN:  1.  Syncope: Had an episode of nonsustained VT on cardiac monitoring.  This was not associated with symptoms.  Fortunately his ejection fraction is normal and has had a coronary CTA that shows no evidence of coronary artery disease.  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  He has since been put on colchicine.  After his emergency room visit at [facility], he  felt quite poorly for a few days and was diagnosed with Covid.  It is certainly possible that his episode of syncope was due to his Covid diagnosis and myocarditis.  He is now on colchicine.  I will switch him to metoprolol from diltiazem.  Otherwise conservative management.  After a few months, once his myocarditis has resolved, metoprolol could be stopped.  Case discussed with primary cardiology  Current medicines are reviewed at length with the patient today.   The patient has concerns regarding his medicines.  The following changes were made today: Stop diltiazem, start Toprol-XL 100 mg  Labs/ tests ordered today include:  Orders Placed This Encounter  Procedures  . EKG 12-Lead     Disposition:   FU with Will Camnitz PRN  Signed, Will Meredith Leeds, MD  12/15/2020 9:18 AM     [facility] [address] [place]  [phone] (office) [phone] (fax)

## 2020-12-15 NOTE — Patient Instructions (Addendum)
Medication Instructions:  Your physician has recommended you make the following change in your medication:  1. STOP Diltiazem 2. START Toprol 100 mg at bedtime  *If you need a refill on your cardiac medications before your next appointment, please call your pharmacy*   Lab Work: None ordered   Testing/Procedures: None ordered   Follow-Up: At [facility], you and your health needs are our priority.  As part of our continuing mission to provide you with exceptional heart care, we have created designated Provider Care Teams.  These Care Teams include your primary Cardiologist (physician) and Advanced Practice Providers (APPs -  Physician Assistants and Nurse Practitioners) who all work together to provide you with the care you need, when you need it.  Your next appointment:    as needed  The format for your next appointment:   In Person  Provider:   Allegra Lai, MD  Keep your scheduled follow up with Dr. Harriet Masson 01/05/21   Thank you for choosing [facility]!!   [phone]   Other Instructions  Metoprolol Extended-Release Tablets What is this medicine? METOPROLOL (me TOE proe lole) is a beta blocker. It decreases the amount of work your heart has to do and helps your heart beat regularly. It treats high blood pressure and/or prevent chest pain (also called angina). It also treats heart failure. This medicine may be used for other purposes; ask your health care provider or pharmacist if you have questions. COMMON BRAND NAME(S): toprol, Toprol XL What should I tell my health care provider before I take this medicine? They need to know if you have any of these conditions:  diabetes  heart or vessel disease like slow heart rate, worsening heart failure, heart block, sick sinus syndrome or Raynaud's disease  kidney disease  liver disease  lung or breathing disease, like asthma or emphysema  pheochromocytoma  thyroid disease  an unusual or allergic reaction  to metoprolol, other beta-blockers, medicines, foods, dyes, or preservatives  pregnant or trying to get pregnant  breast-feeding How should I use this medicine? Take this drug by mouth. Take it as directed on the prescription label at the same time every day. Take it with food. You may cut the tablet in half if it is scored (has a line in the middle of it). This may help you swallow the tablet if the whole tablet is too big. Be sure to take both halves. Do not take just one-half of the tablet. Keep taking it unless your health care provider tells you to stop. Talk to your health care provider about the use of this drug in children. While it may be prescribed for children as young as 6 for selected conditions, precautions do apply. Overdosage: If you think you have taken too much of this medicine contact a poison control center or emergency room at once. NOTE: This medicine is only for you. Do not share this medicine with others. What if I miss a dose? If you miss a dose, take it as soon as you can. If it is almost time for your next dose, take only that dose. Do not take double or extra doses. What may interact with this medicine? This medicine may interact with the following medications:  certain medicines for blood pressure, heart disease, irregular heart beat  certain medicines for depression, like monoamine oxidase (MAO) inhibitors, fluoxetine, or paroxetine  clonidine  dobutamine  epinephrine  isoproterenol  reserpine This list may not describe all possible interactions. Give your health care  provider a list of all the medicines, herbs, non-prescription drugs, or dietary supplements you use. Also tell them if you smoke, drink alcohol, or use illegal drugs. Some items may interact with your medicine. What should I watch for while using this medicine? Visit your doctor or health care professional for regular check ups. Contact your doctor right away if your symptoms worsen. Check  your blood pressure and pulse rate regularly. Ask your health care professional what your blood pressure and pulse rate should be, and when you should contact them. You may get drowsy or dizzy. Do not drive, use machinery, or do anything that needs mental alertness until you know how this medicine affects you. Do not sit or stand up quickly, especially if you are an older patient. This reduces the risk of dizzy or fainting spells. Contact your doctor if these symptoms continue. Alcohol may interfere with the effect of this medicine. Avoid alcoholic drinks. This medicine may increase blood sugar. Ask your healthcare provider if changes in diet or medicines are needed if you have diabetes. What side effects may I notice from receiving this medicine? Side effects that you should report to your doctor or health care professional as soon as possible:  allergic reactions like skin rash, itching or hives  cold or numb hands or feet  depression  difficulty breathing  faint  fever with sore throat  irregular heartbeat, chest pain  rapid weight gain  signs and symptoms of high blood sugar such as being more thirsty or hungry or having to urinate more than normal. You may also feel very tired or have blurry vision.  swollen legs or ankles Side effects that usually do not require medical attention (report to your doctor or health care professional if they continue or are bothersome):  anxiety or nervousness  change in sex drive or performance  dry skin  headache  nightmares or trouble sleeping  short term memory loss  stomach upset or diarrhea This list may not describe all possible side effects. Call your doctor for medical advice about side effects. You may report side effects to FDA at 1-800-FDA-1088. Where should I keep my medicine? Keep out of the reach of children and pets. Store at room temperature between 20 and 25 degrees C (68 and 77 degrees F). Throw away any unused drug  after the expiration date. NOTE: This sheet is a summary. It may not cover all possible information. If you have questions about this medicine, talk to your doctor, pharmacist, or health care provider.  2021 [employer]vier/Gold Standard (2019-04-19 18:23:00)

## 2021-01-01 ENCOUNTER — Other Ambulatory Visit (HOSPITAL_BASED_OUTPATIENT_CLINIC_OR_DEPARTMENT_OTHER): Payer: Self-pay

## 2021-01-02 ENCOUNTER — Other Ambulatory Visit (HOSPITAL_BASED_OUTPATIENT_CLINIC_OR_DEPARTMENT_OTHER): Payer: Self-pay

## 2021-01-02 MED FILL — Amphetamine-Dextroamphetamine Tab 10 MG: ORAL | 30 days supply | Qty: 60 | Fill #0 | Status: AC

## 2021-01-05 ENCOUNTER — Encounter: Payer: Self-pay | Admitting: Cardiology

## 2021-01-05 ENCOUNTER — Other Ambulatory Visit: Payer: Self-pay

## 2021-01-05 ENCOUNTER — Other Ambulatory Visit (HOSPITAL_BASED_OUTPATIENT_CLINIC_OR_DEPARTMENT_OTHER): Payer: Self-pay

## 2021-01-05 ENCOUNTER — Ambulatory Visit (INDEPENDENT_AMBULATORY_CARE_PROVIDER_SITE_OTHER): Payer: BC Managed Care – PPO | Admitting: Cardiology

## 2021-01-05 VITALS — BP 118/82 | HR 66 | Ht 79.0 in | Wt 225.0 lb

## 2021-01-05 DIAGNOSIS — I4729 Other ventricular tachycardia: Secondary | ICD-10-CM

## 2021-01-05 DIAGNOSIS — R55 Syncope and collapse: Secondary | ICD-10-CM

## 2021-01-05 DIAGNOSIS — I514 Myocarditis, unspecified: Secondary | ICD-10-CM | POA: Diagnosis not present

## 2021-01-05 DIAGNOSIS — I472 Ventricular tachycardia: Secondary | ICD-10-CM | POA: Diagnosis not present

## 2021-01-05 NOTE — Patient Instructions (Signed)

## 2021-01-05 NOTE — Progress Notes (Signed)
Cardiology Office Note:    Date:  01/05/2021   ID:  Keith Wright, DOB [birth year], MRN [id number]  PCP:  Bartholome Bill, MD  Cardiologist:  Berniece Salines, DO  Electrophysiologist:  None   Referring MD: Bartholome Bill, MD   Chief Complaint  Patient presents with  . Follow-up   History of Present Illness:    Keith Wright is a 40 y.o. male with a  hx ofsyncope at age 22 when he was playing basketball and he passed out. During that time he was worked he had a stress test as well as an echocardiogram he was told he has LVH and he was educated that this could have been affects heart.  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 He said he felt sweaty and as he tried to [employer] together he passed out.He is not sure how long he was out for.  During that time he also reported that he has been experiencing intermittent chest discomfort as well the shortness of breath.    I saw the patient November 07, 2020 we discussed his ZIO monitor as well as his coronary CTA.  Given his NSVT I recommended he undergo a cardiac MRI with EP evaluation.  In the interim he has had his cardiac MRI which showed focal myocarditis and has been started on colchicine.  He also has seen EP with no further diagnostic work-up required.  Cardizem was stopped the patient was started on beta-blocker.  Today he is here for follow-up visit.  He has not had any worsening symptoms.  No recurrent episode or syncope.  He has not had any adverse effects to his colchicine.   Past Medical History:  Diagnosis Date  . Elevated platelet count   . History of chicken pox     Past Surgical History:  Procedure Laterality Date  . WISDOM TOOTH EXTRACTION      Current Medications: Current Meds   Medication Sig  . amphetamine-dextroamphetamine (ADDERALL) 10 MG tablet Take 1 tablet by mouth 2 (two) times daily.  Marland Kitchen amphetamine-dextroamphetamine (ADDERALL) 10 MG tablet TAKE 1 TABLET (10 MG TOTAL) BY MOUTH 2 TIMES DAILY. 01/02/21  . amphetamine-dextroamphetamine (ADDERALL) 10 MG tablet TAKE 1 TABLET (10 MG TOTAL) BY MOUTH 2 TIMES DAILY FOR 30 DAYS.  Marland Kitchen amphetamine-dextroamphetamine (ADDERALL) 10 MG tablet TAKE 1 TABLET (10 MG TOTAL) BY MOUTH 2 TIMES DAILY FOR 30 DAYS.  Marland Kitchen colchicine 0.6 MG tablet Take 0.6 mg by mouth daily.  . metoprolol succinate (TOPROL-XL) 100 MG 24 hr tablet Take 100 mg by mouth daily. Take with or immediately following a meal.     Allergies:   Patient has no known allergies.   Social History   Socioeconomic History  . Marital status: Married    Spouse name: Not on file  . Number of children: Not on file  . Years of education: Not on file  . Highest education level: Not on file  Occupational History  . Occupation: Pharmacist  Tobacco Use  . Smoking status: Never Smoker  . Smokeless tobacco: Never Used  Substance and Sexual Activity  . Alcohol use: Yes    Alcohol/week: 0.0 standard drinks    Comment: social -- 1 per month  . Drug use:  No  . Sexual activity: Yes    Partners: Female  Other Topics Concern  . Not on file  Social History Narrative  . Not on file   Social Determinants of Health   Financial Resource Strain: Not on file  Food Insecurity: Not on file  Transportation Needs: Not on file  Physical Activity: Not on file  Stress: Not on file  Social Connections: Not on file     Family History: The patient's family history includes Colon cancer (age of onset: 76) in his paternal grandmother; Healthy in his brother, daughter, father, and mother; Heart attack in his maternal grandfather.  ROS:   Review of Systems  Constitution: Negative for decreased appetite, fever and weight gain.  HENT: Negative for congestion, ear discharge, hoarse voice  and sore throat.   Eyes: Negative for discharge, redness, vision loss in right eye and visual halos.  Cardiovascular: Negative for chest pain, dyspnea on exertion, leg swelling, orthopnea and palpitations.  Respiratory: Negative for cough, hemoptysis, shortness of breath and snoring.   Endocrine: Negative for heat intolerance and polyphagia.  Hematologic/Lymphatic: Negative for bleeding problem. Does not bruise/bleed easily.  Skin: Negative for flushing, nail changes, rash and suspicious lesions.  Musculoskeletal: Negative for arthritis, joint pain, muscle cramps, myalgias, neck pain and stiffness.  Gastrointestinal: Negative for abdominal pain, bowel incontinence, diarrhea and excessive appetite.  Genitourinary: Negative for decreased libido, genital sores and incomplete emptying.  Neurological: Negative for brief paralysis, focal weakness, headaches and loss of balance.  Psychiatric/Behavioral: Negative for altered mental status, depression and suicidal ideas.  Allergic/Immunologic: Negative for HIV exposure and persistent infections.    EKGs/Labs/Other Studies Reviewed:    The following studies were reviewed today:   EKG: None today  TTE 10/31/20  Review of the above records today demonstrates:  1. Left ventricular ejection fraction, by estimation, is 60 to 65%. The  left ventricle has normal function. The left ventricle has no regional  wall motion abnormalities. There is mild concentric left ventricular  hypertrophy. Left ventricular diastolic  parameters were normal.  2. Right ventricular systolic function is normal. The right ventricular  size is normal.  3. The mitral valve is normal in structure. No evidence of mitral valve  regurgitation. No evidence of mitral stenosis.  4. The aortic valve is normal in structure. Aortic valve regurgitation is  trivial. No aortic stenosis is present.  5. The inferior vena cava is normal in size with greater than 50%  respiratory  variability, suggesting right atrial pressure of 3 mmHg.   Monitor 11/05/20 personally reviewed The patient wore the monitor for 13 days 19 hours hours starting starting October 11, 2020. Indication: Syncope  The minimum heart rate was 44 bpm, maximum heart rate was 186 bpm, and average heart rate was 89 bpm. Predominant underlying rhythm was Sinus Rhythm.  1 run of Ventricular Tachycardia occurred lasting 12 beats with a maximum rate of 171bpm (average 156 bpm).  Premature atrial complexes were rare, less than 1%. Premature Ventricular complexes were rare, less than 1%.  No pauses, No AV block, no supraventricular tachycardia and no atrial fibrillation present.  44 patient triggered events mostly associated with sinus tachycardia.  Conclusion: This study is remarkable for nonsustained ventricular tachycardia.  Coronary CTA 11/04/2020 1. Coronary calcium score of 0. This was 0 percentile for age and sex matched control.  2. Normal coronary origin with left dominance.  3. No evidence of CAD  Cardiac MRI 12/09/2020 1. Normal left ventricular size and function, LVEF  53%. Mild LVH. Mild hypokinesis of inferoapical segment.  2. Normal right ventricular size by indexed volume. RVEF 44%, suggests borderline reduced systolic function. Visually, RV function appears normal.  3. There is post contrast delayed myocardial enhancement in one segment of LV apex. The epicardium and midmyocardium of the inferoapex demonstrate delayed myocardial enhancement with no definite myocardial edema. Findings most consistent with focal Myocarditis.  Recent Labs: 09/30/2020: BUN 14; Creatinine, Ser 0.97; Hemoglobin 12.7; Platelets 316; Potassium 4.7; Sodium 134  Recent Lipid Panel    Component Value Date/Time   CHOL 177 08/13/2015 0759   TRIG 55.0 08/13/2015 0759   HDL 46.90 08/13/2015 0759   CHOLHDL 4 08/13/2015 0759   VLDL 11.0 08/13/2015 0759   LDLCALC 119 (H) 08/13/2015 0759     Physical Exam:    VS:  BP 118/82 (BP Location: Right Arm, Patient Position: Sitting, Cuff Size: Normal)   Pulse 66   Ht 6\' 7"  (2.007 m)   Wt 225 lb (102.1 kg)   SpO2 99%   BMI 25.35 kg/m     Wt Readings from Last 3 Encounters:  01/05/21 225 lb (102.1 kg)  12/15/20 227 lb (103 kg)  11/07/20 222 lb (100.7 kg)     GEN: Well nourished, well developed in no acute distress HEENT: Normal NECK: No JVD; No carotid bruits LYMPHATICS: No lymphadenopathy CARDIAC: S1S2 noted,RRR, no murmurs, rubs, gallops RESPIRATORY:  Clear to auscultation without rales, wheezing or rhonchi  ABDOMEN: Soft, non-tender, non-distended, +bowel sounds, no guarding. EXTREMITIES: No edema, No cyanosis, no clubbing MUSCULOSKELETAL:  No deformity  SKIN: Warm and dry NEUROLOGIC:  Alert and oriented x 3, non-focal PSYCHIATRIC:  Normal affect, good insight  ASSESSMENT:    1. Syncope and collapse   2. NSVT (nonsustained ventricular tachycardia) ([place])   3. Myocarditis, unspecified chronicity, unspecified myocarditis type ([place])    PLAN:     No recurrent syncope episode agree with EGD with no further diagnostic work-up at this time.  Continue the patient on his current dose of his beta-blocker for his NSVT. He is continuing his colchicine for 90-day completion.  Asked the patient to notify my office if he experienced any recurrent symptoms.  The patient is in agreement with the above plan. The patient left the office in stable condition.  The patient will follow up in 1 year or as needed.   Medication Adjustments/Labs and Tests Ordered: Current medicines are reviewed at length with the patient today.  Concerns regarding medicines are outlined above.  No orders of the defined types were placed in this encounter.  No orders of the defined types were placed in this encounter.   Patient Instructions  Medication Instructions:  Your physician recommends that you continue on your current medications as  directed. Please refer to the Current Medication list given to you today.  *If you need a refill on your cardiac medications before your next appointment, please call your pharmacy*   Lab Work: None If you have labs (blood work) drawn today and your tests are completely normal, you will receive your results only by: Marland Kitchen MyChart Message (if you have MyChart) OR . A paper copy in the mail If you have any lab test that is abnormal or we need to change your treatment, we will call you to review the results.   Testing/Procedures: None   Follow-Up: At [facility], you and your health needs are our priority.  As part of our continuing mission to provide you with exceptional heart care, we  have created designated Provider Care Teams.  These Care Teams include your primary Cardiologist (physician) and Advanced Practice Providers (APPs -  Physician Assistants and Nurse Practitioners) who all work together to provide you with the care you need, when you need it.  We recommend signing up for the patient portal called "MyChart".  Sign up information is provided on this After Visit Summary.  MyChart is used to connect with patients for Virtual Visits (Telemedicine).  Patients are able to view lab/test results, encounter notes, upcoming appointments, etc.  Non-urgent messages can be sent to your provider as well.   To learn more about what you can do with MyChart, go to [email or web address].    Your next appointment:   As needed  The format for your next appointment:   In Person  Provider:   Berniece Salines, DO   Other Instructions      Adopting a Healthy Lifestyle.  Know what a healthy weight is for you (roughly BMI <25) and aim to maintain this   Aim for 7+ servings of fruits and vegetables daily   65-80+ fluid ounces of water or unsweet tea for healthy kidneys   Limit to max 1 drink of alcohol per day; avoid smoking/tobacco   Limit animal fats in diet for cholesterol and heart  health - choose grass fed whenever available   Avoid highly processed foods, and foods high in saturated/trans fats   Aim for low stress - take time to unwind and care for your mental health   Aim for 150 min of moderate intensity exercise weekly for heart health, and weights twice weekly for bone health   Aim for 7-9 hours of sleep daily   When it comes to diets, agreement about the perfect plan isnt easy to find, even among the experts. Experts at the [place] developed an idea known as the Healthy Eating Plate. Just imagine a plate divided into logical, healthy portions.   The emphasis is on diet quality:   Load up on vegetables and fruits - one-half of your plate: Aim for color and variety, and remember that potatoes dont count.   Go for whole grains - one-quarter of your plate: Whole wheat, barley, wheat berries, quinoa, oats, brown rice, and foods made with them. If you want pasta, go with whole wheat pasta.   Protein power - one-quarter of your plate: Fish, chicken, beans, and nuts are all healthy, versatile protein sources. Limit red meat.   The diet, however, does go beyond the plate, offering a few other suggestions.   Use healthy plant oils, such as olive, canola, soy, corn, sunflower and peanut. Check the labels, and avoid partially hydrogenated oil, which have unhealthy trans fats.   If youre thirsty, drink water. Coffee and tea are good in moderation, but skip sugary drinks and limit milk and dairy products to one or two daily servings.   The type of carbohydrate in the diet is more important than the amount. Some sources of carbohydrates, such as vegetables, fruits, whole grains, and beans-are healthier than others.   Finally, stay active  Signed, Berniece Salines, DO  01/05/2021 11:41 AM    [place]

## 2021-01-08 ENCOUNTER — Other Ambulatory Visit (HOSPITAL_BASED_OUTPATIENT_CLINIC_OR_DEPARTMENT_OTHER): Payer: Self-pay

## 2021-01-14 ENCOUNTER — Other Ambulatory Visit: Payer: Self-pay | Admitting: Cardiology

## 2021-01-14 ENCOUNTER — Other Ambulatory Visit (HOSPITAL_BASED_OUTPATIENT_CLINIC_OR_DEPARTMENT_OTHER): Payer: Self-pay

## 2021-01-14 MED ORDER — COLCHICINE 0.6 MG PO TABS
0.6000 mg | ORAL_TABLET | Freq: Every day | ORAL | 3 refills | Status: DC
  Filled 2021-01-14 – 2021-01-22 (×2): qty 30, 30d supply, fill #0
  Filled 2021-02-19: qty 30, 30d supply, fill #1
  Filled 2021-03-24: qty 30, 30d supply, fill #2
  Filled 2021-04-21: qty 30, 30d supply, fill #3

## 2021-01-14 NOTE — Telephone Encounter (Signed)
Rx refill sent to pharmacy. 

## 2021-01-15 ENCOUNTER — Other Ambulatory Visit (HOSPITAL_BASED_OUTPATIENT_CLINIC_OR_DEPARTMENT_OTHER): Payer: Self-pay

## 2021-01-21 ENCOUNTER — Other Ambulatory Visit: Payer: Self-pay | Admitting: Cardiology

## 2021-01-21 ENCOUNTER — Other Ambulatory Visit (HOSPITAL_BASED_OUTPATIENT_CLINIC_OR_DEPARTMENT_OTHER): Payer: Self-pay

## 2021-01-21 MED ORDER — METOPROLOL SUCCINATE ER 100 MG PO TB24
ORAL_TABLET | ORAL | 3 refills | Status: DC
  Filled 2021-01-21 – 2021-01-22 (×2): qty 30, 30d supply, fill #0
  Filled 2021-02-19: qty 30, 30d supply, fill #1
  Filled 2021-03-24: qty 30, 30d supply, fill #2
  Filled 2021-04-21: qty 30, 30d supply, fill #3

## 2021-01-22 ENCOUNTER — Other Ambulatory Visit (HOSPITAL_BASED_OUTPATIENT_CLINIC_OR_DEPARTMENT_OTHER): Payer: Self-pay

## 2021-01-30 ENCOUNTER — Other Ambulatory Visit (HOSPITAL_BASED_OUTPATIENT_CLINIC_OR_DEPARTMENT_OTHER): Payer: Self-pay

## 2021-01-30 MED ORDER — AMPHETAMINE-DEXTROAMPHETAMINE 10 MG PO TABS
ORAL_TABLET | ORAL | 0 refills | Status: DC
  Filled 2021-01-30: qty 60, 30d supply, fill #0

## 2021-02-09 ENCOUNTER — Other Ambulatory Visit (HOSPITAL_BASED_OUTPATIENT_CLINIC_OR_DEPARTMENT_OTHER): Payer: Self-pay

## 2021-02-17 ENCOUNTER — Telehealth: Payer: Self-pay

## 2021-02-17 DIAGNOSIS — Z006 Encounter for examination for normal comparison and control in clinical research program: Secondary | ICD-10-CM

## 2021-02-17 NOTE — Telephone Encounter (Signed)
Called patient for 90 day Identify phone call no answer, I left a voicemail stating the intent of the phone call and our call back number to be reached in our department. 

## 2021-02-19 ENCOUNTER — Other Ambulatory Visit (HOSPITAL_BASED_OUTPATIENT_CLINIC_OR_DEPARTMENT_OTHER): Payer: Self-pay

## 2021-02-23 ENCOUNTER — Other Ambulatory Visit (HOSPITAL_BASED_OUTPATIENT_CLINIC_OR_DEPARTMENT_OTHER): Payer: Self-pay

## 2021-02-24 ENCOUNTER — Telehealth: Payer: Self-pay | Admitting: *Deleted

## 2021-02-24 DIAGNOSIS — Z006 Encounter for examination for normal comparison and control in clinical research program: Secondary | ICD-10-CM

## 2021-02-24 NOTE — Telephone Encounter (Signed)
I called patient for 90-day Identify Study phone call. I left message for patient to call or can respond to my e-mail.

## 2021-02-25 ENCOUNTER — Other Ambulatory Visit (HOSPITAL_BASED_OUTPATIENT_CLINIC_OR_DEPARTMENT_OTHER): Payer: Self-pay

## 2021-02-25 MED ORDER — AMPHETAMINE-DEXTROAMPHETAMINE 10 MG PO TABS
ORAL_TABLET | ORAL | 0 refills | Status: DC
  Filled 2021-02-25: qty 60, 30d supply, fill #0

## 2021-03-02 ENCOUNTER — Telehealth: Payer: Self-pay

## 2021-03-02 DIAGNOSIS — Z006 Encounter for examination for normal comparison and control in clinical research program: Secondary | ICD-10-CM

## 2021-03-02 NOTE — Telephone Encounter (Signed)
Called patient for 90 day Identify phone call no answer, I left a voicemail stating the intent of the phone call and our call back number to be reached in our department. 

## 2021-03-04 ENCOUNTER — Other Ambulatory Visit (HOSPITAL_BASED_OUTPATIENT_CLINIC_OR_DEPARTMENT_OTHER): Payer: Self-pay

## 2021-03-20 ENCOUNTER — Other Ambulatory Visit (HOSPITAL_BASED_OUTPATIENT_CLINIC_OR_DEPARTMENT_OTHER): Payer: Self-pay

## 2021-03-24 ENCOUNTER — Other Ambulatory Visit (HOSPITAL_BASED_OUTPATIENT_CLINIC_OR_DEPARTMENT_OTHER): Payer: Self-pay

## 2021-03-24 MED ORDER — AMPHETAMINE-DEXTROAMPHETAMINE 10 MG PO TABS
ORAL_TABLET | ORAL | 0 refills | Status: DC
  Filled 2021-04-06: qty 60, 30d supply, fill #0

## 2021-03-24 MED ORDER — AMPHETAMINE-DEXTROAMPHETAMINE 10 MG PO TABS
10.0000 mg | ORAL_TABLET | Freq: Every day | ORAL | 0 refills | Status: DC
  Filled 2021-05-08: qty 30, 30d supply, fill #0
  Filled 2021-06-08: qty 60, 60d supply, fill #0

## 2021-04-06 ENCOUNTER — Other Ambulatory Visit (HOSPITAL_BASED_OUTPATIENT_CLINIC_OR_DEPARTMENT_OTHER): Payer: Self-pay

## 2021-04-21 ENCOUNTER — Other Ambulatory Visit (HOSPITAL_BASED_OUTPATIENT_CLINIC_OR_DEPARTMENT_OTHER): Payer: Self-pay

## 2021-05-08 ENCOUNTER — Other Ambulatory Visit (HOSPITAL_BASED_OUTPATIENT_CLINIC_OR_DEPARTMENT_OTHER): Payer: Self-pay

## 2021-05-08 MED ORDER — AMPHETAMINE-DEXTROAMPHETAMINE 10 MG PO TABS
ORAL_TABLET | ORAL | 0 refills | Status: DC
  Filled 2021-06-08: qty 60, 30d supply, fill #0

## 2021-05-08 MED ORDER — AMPHETAMINE-DEXTROAMPHETAMINE 10 MG PO TABS
ORAL_TABLET | ORAL | 0 refills | Status: DC
  Filled 2021-05-08: qty 60, 30d supply, fill #0

## 2021-05-08 MED ORDER — AMPHETAMINE-DEXTROAMPHETAMINE 10 MG PO TABS
ORAL_TABLET | ORAL | 0 refills | Status: DC
  Filled 2021-07-09: qty 60, 30d supply, fill #0

## 2021-05-26 ENCOUNTER — Other Ambulatory Visit (HOSPITAL_BASED_OUTPATIENT_CLINIC_OR_DEPARTMENT_OTHER): Payer: Self-pay

## 2021-05-26 ENCOUNTER — Other Ambulatory Visit: Payer: Self-pay | Admitting: Cardiology

## 2021-05-26 MED ORDER — METOPROLOL SUCCINATE ER 100 MG PO TB24
ORAL_TABLET | ORAL | 3 refills | Status: DC
  Filled 2021-05-26: qty 30, 30d supply, fill #0
  Filled 2021-07-02: qty 30, 30d supply, fill #1
  Filled 2021-08-10: qty 30, 30d supply, fill #2
  Filled 2021-09-09: qty 30, 30d supply, fill #3

## 2021-06-08 ENCOUNTER — Other Ambulatory Visit (HOSPITAL_BASED_OUTPATIENT_CLINIC_OR_DEPARTMENT_OTHER): Payer: Self-pay

## 2021-07-02 ENCOUNTER — Other Ambulatory Visit (HOSPITAL_BASED_OUTPATIENT_CLINIC_OR_DEPARTMENT_OTHER): Payer: Self-pay

## 2021-07-03 ENCOUNTER — Other Ambulatory Visit (HOSPITAL_BASED_OUTPATIENT_CLINIC_OR_DEPARTMENT_OTHER): Payer: Self-pay

## 2021-07-03 MED ORDER — CEPHALEXIN 500 MG PO CAPS
ORAL_CAPSULE | ORAL | 0 refills | Status: DC
  Filled 2021-07-03: qty 14, 7d supply, fill #0

## 2021-07-06 ENCOUNTER — Ambulatory Visit: Payer: BC Managed Care – PPO | Admitting: Family Medicine

## 2021-07-09 ENCOUNTER — Other Ambulatory Visit (HOSPITAL_BASED_OUTPATIENT_CLINIC_OR_DEPARTMENT_OTHER): Payer: Self-pay

## 2021-07-09 MED ORDER — SULFAMETHOXAZOLE-TRIMETHOPRIM 800-160 MG PO TABS
1.0000 | ORAL_TABLET | Freq: Two times a day (BID) | ORAL | 0 refills | Status: DC
  Filled 2021-07-09: qty 14, 7d supply, fill #0

## 2021-07-09 MED ORDER — CLINDAMYCIN HCL 300 MG PO CAPS
ORAL_CAPSULE | ORAL | 0 refills | Status: DC
  Filled 2021-07-09: qty 14, 7d supply, fill #0

## 2021-07-09 MED ORDER — MUPIROCIN 2 % EX OINT
TOPICAL_OINTMENT | CUTANEOUS | 1 refills | Status: DC
  Filled 2021-07-09: qty 22, 7d supply, fill #0
  Filled 2021-09-10: qty 22, 7d supply, fill #1

## 2021-07-14 ENCOUNTER — Other Ambulatory Visit (HOSPITAL_BASED_OUTPATIENT_CLINIC_OR_DEPARTMENT_OTHER): Payer: Self-pay

## 2021-07-14 MED ORDER — CLINDAMYCIN HCL 300 MG PO CAPS
300.0000 mg | ORAL_CAPSULE | Freq: Two times a day (BID) | ORAL | 0 refills | Status: DC
  Filled 2021-07-14: qty 14, 7d supply, fill #0

## 2021-07-14 MED ORDER — SULFAMETHOXAZOLE-TRIMETHOPRIM 800-160 MG PO TABS
1.0000 | ORAL_TABLET | Freq: Two times a day (BID) | ORAL | 0 refills | Status: DC
  Filled 2021-07-14: qty 14, 7d supply, fill #0

## 2021-07-21 ENCOUNTER — Other Ambulatory Visit (HOSPITAL_BASED_OUTPATIENT_CLINIC_OR_DEPARTMENT_OTHER): Payer: Self-pay

## 2021-08-10 ENCOUNTER — Other Ambulatory Visit (HOSPITAL_BASED_OUTPATIENT_CLINIC_OR_DEPARTMENT_OTHER): Payer: Self-pay

## 2021-08-24 ENCOUNTER — Other Ambulatory Visit (HOSPITAL_BASED_OUTPATIENT_CLINIC_OR_DEPARTMENT_OTHER): Payer: Self-pay

## 2021-08-24 MED ORDER — AMPHETAMINE-DEXTROAMPHETAMINE 10 MG PO TABS
ORAL_TABLET | ORAL | 0 refills | Status: DC
  Filled 2021-08-24: qty 60, 30d supply, fill #0

## 2021-09-09 ENCOUNTER — Other Ambulatory Visit (HOSPITAL_BASED_OUTPATIENT_CLINIC_OR_DEPARTMENT_OTHER): Payer: Self-pay

## 2021-09-10 ENCOUNTER — Other Ambulatory Visit (HOSPITAL_BASED_OUTPATIENT_CLINIC_OR_DEPARTMENT_OTHER): Payer: Self-pay

## 2021-09-25 ENCOUNTER — Encounter: Payer: Self-pay | Admitting: Cardiology

## 2021-09-25 ENCOUNTER — Other Ambulatory Visit (HOSPITAL_BASED_OUTPATIENT_CLINIC_OR_DEPARTMENT_OTHER): Payer: Self-pay

## 2021-09-25 MED ORDER — METOPROLOL SUCCINATE ER 100 MG PO TB24
ORAL_TABLET | ORAL | 3 refills | Status: DC
  Filled 2021-09-25: qty 30, fill #0
  Filled 2021-10-05: qty 30, 30d supply, fill #0
  Filled 2021-11-09: qty 30, 30d supply, fill #1
  Filled 2021-12-18: qty 30, 30d supply, fill #2
  Filled 2022-01-20: qty 30, 30d supply, fill #3

## 2021-09-25 MED ORDER — AMPHETAMINE-DEXTROAMPHETAMINE 10 MG PO TABS
ORAL_TABLET | ORAL | 0 refills | Status: DC
  Filled 2021-09-25: qty 60, 30d supply, fill #0

## 2021-10-05 ENCOUNTER — Other Ambulatory Visit (HOSPITAL_BASED_OUTPATIENT_CLINIC_OR_DEPARTMENT_OTHER): Payer: Self-pay

## 2021-10-20 IMAGING — CT CT CERVICAL SPINE W/O CM
3 of 4 series · 12 of 33 positions shown, 14 images · non-contrast
Comparison: Report from CT head dated 01/11/1995

CLINICAL DATA: Syncope, fall with laceration between the ice. Neck
stiffness.

EXAM:
CT HEAD WITHOUT CONTRAST
CT MAXILLOFACIAL WITHOUT CONTRAST
CT CERVICAL SPINE WITHOUT CONTRAST
TECHNIQUE: Multidetector CT imaging of the head, cervical spine, and
maxillofacial structures were performed using the standard protocol
without intravenous contrast. Multiplanar CT image reconstructions
of the cervical spine and maxillofacial structures were also
generated.

[Series 4: c_spine 2.0 st · axial · 0.32mm/px · z∈[-236,-86]mm · 4 of 113 slices shown, 5 images]
[im 19/113  soft-tissue]
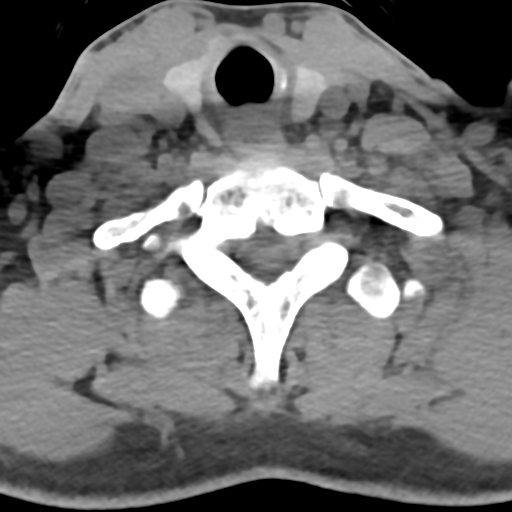
[im 19/113  bone]
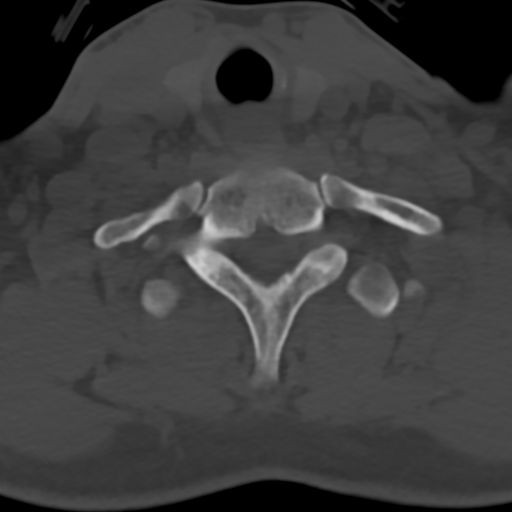
[im 38/113  bone]
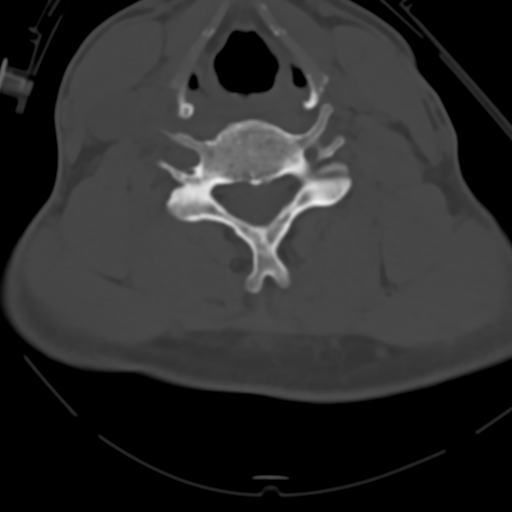
[im 75/113  bone]
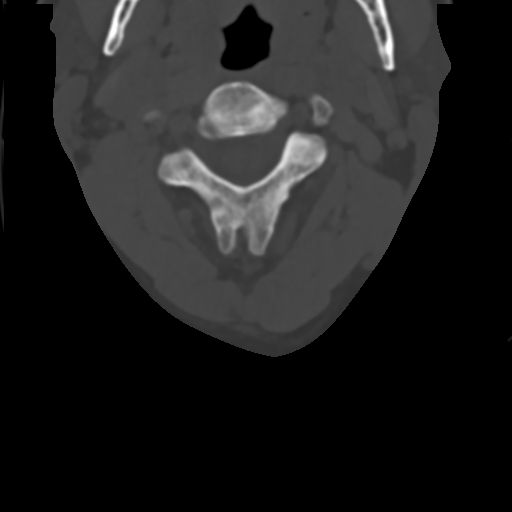
[im 94/113  bone]
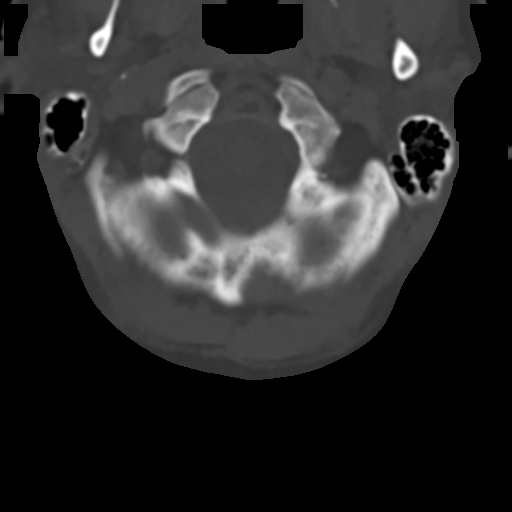

[Series 8: c_spine 2.0 sag bone · sagittal · 0.33mm/px · 5 of 56 slices shown, 6 images]
[im 19/56  bone]
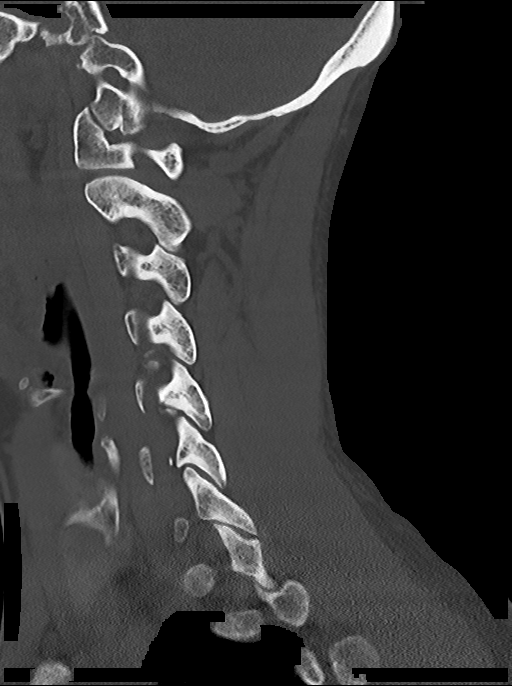
[im 23/56  bone]
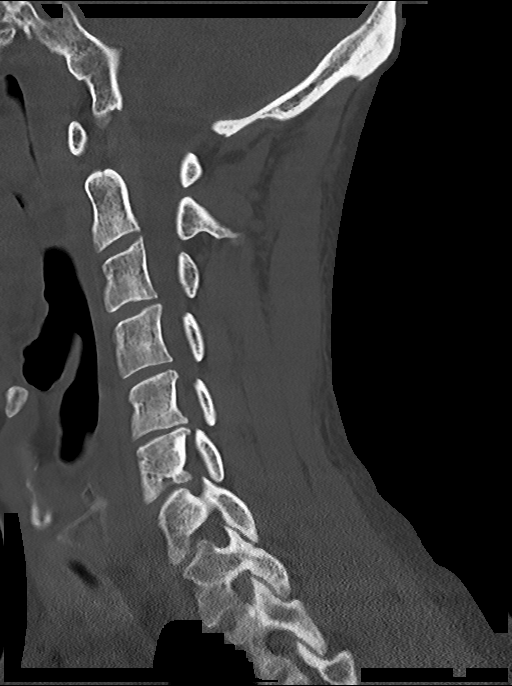
[im 28/56  soft-tissue]
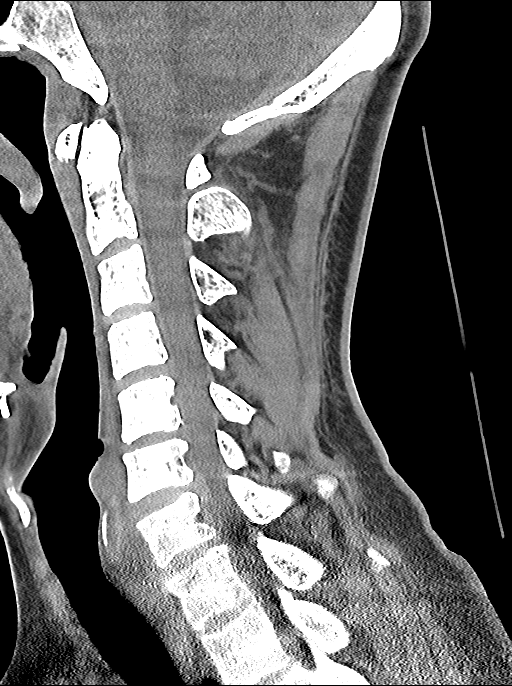
[im 28/56  bone]
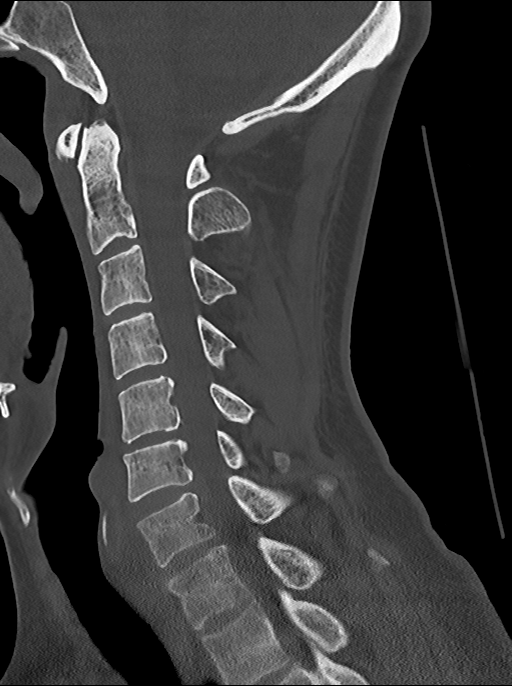
[im 33/56  bone]
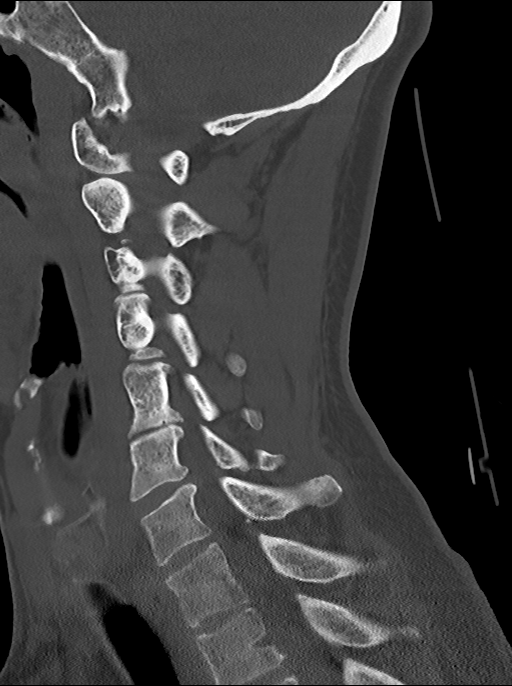
[im 37/56  bone]
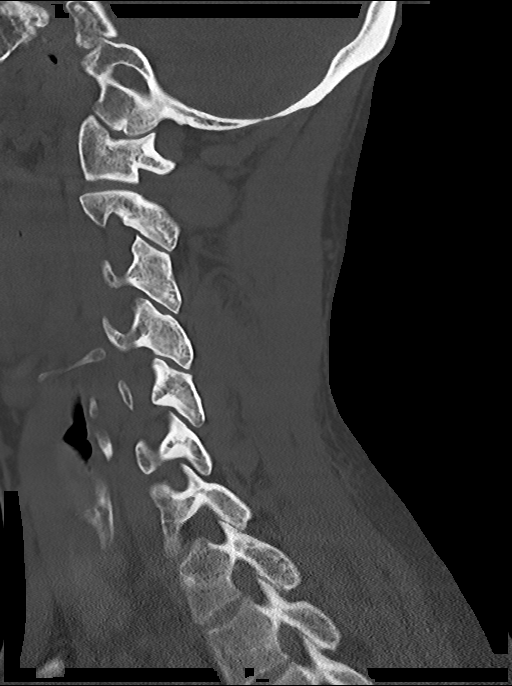

[Series 9: c_spine 2.0 cor bone · coronal · 0.29mm/px · 3 of 81 slices shown]
[im 17/81  bone]
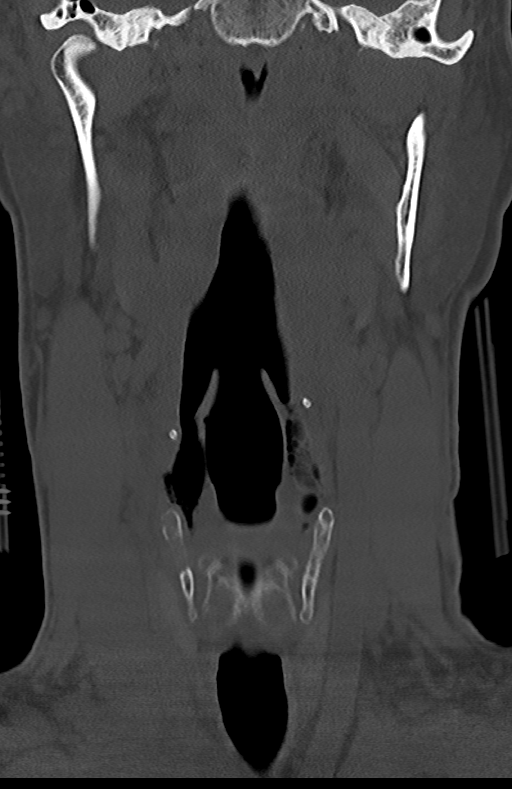
[im 33/81  bone]
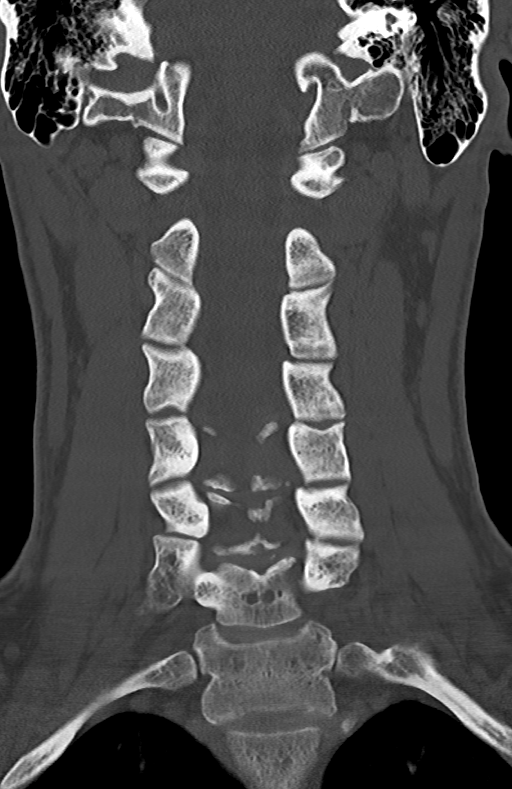
[im 49/81  bone]
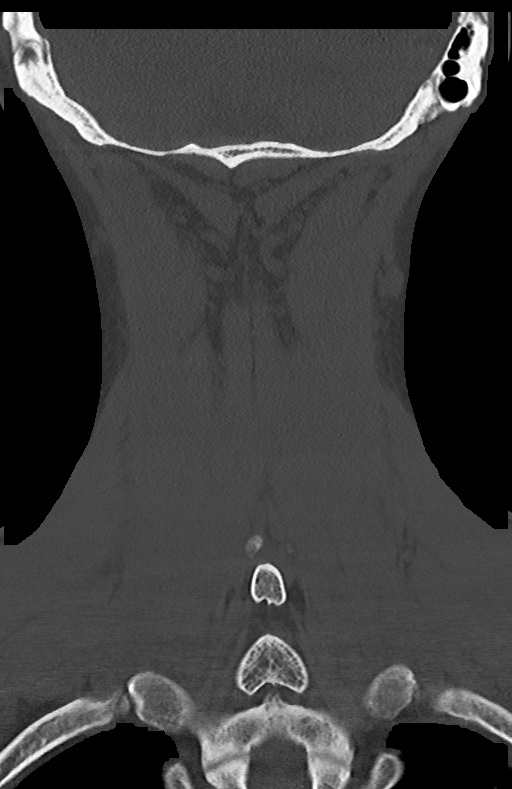

[12 of 33 positions shown; findings below may reference images not displayed]

FINDINGS: CT HEAD FINDINGS

Brain: The brainstem, cerebellum, cerebral peduncles, thalami, basal
ganglia, basilar cisterns, and ventricular system appear within
normal limits. No intracranial hemorrhage, mass lesion, or acute
CVA.

Vascular: Unremarkable

Skull: Unremarkable

Other: Chronic bilateral maxillary and ethmoid sinusitis.

CT MAXILLOFACIAL FINDINGS

Osseous: Bilateral nasal bone fractures are present with now mild
displacement of the distal tip of the nasal bone is shown on image
55 of series 8. There is also some abrupt angulation of the nasal
septum for example on image 67 of series 4 suspicious for a possible
nasal septal fracture.

Orbits: Unremarkable

Sinuses: Chronic bilateral maxillary sinusitis, right greater than
left. Mild chronic ethmoid sinusitis.

Soft tissues: Soft tissue swelling along the nose.

CT CERVICAL SPINE FINDINGS

Alignment: No vertebral subluxation is observed.

Skull base and vertebrae: No fracture or acute bony findings.

Soft tissues and spinal canal: Unremarkable

Disc levels: Suspected left foraminal stenosis at C3-4, C4-5, C5-6,
and C6-7; and right foraminal stenosis at C4-5, C5-6, and C6-7; due
to spondylosis and degenerative disc disease. Suspected central
narrowing of the thecal sac at C6-7 due to right paracentral disc
protrusion.

Upper chest: Unremarkable

Other: No supplemental non-categorized findings.
IMPRESSION: 1. Bilateral nasal bone fractures with now mild displacement of the
distal tip of the nasal bone. There is also some angulation of the
nasal septum suspicious for nasal septal fracture.
2. No acute intracranial findings or acute cervical spine findings.
3. Cervical spondylosis and degenerative disc disease causing
multilevel impingement.
4. Chronic bilateral maxillary and ethmoid sinusitis.

## 2021-10-26 ENCOUNTER — Other Ambulatory Visit (HOSPITAL_BASED_OUTPATIENT_CLINIC_OR_DEPARTMENT_OTHER): Payer: Self-pay

## 2021-10-27 ENCOUNTER — Other Ambulatory Visit (HOSPITAL_BASED_OUTPATIENT_CLINIC_OR_DEPARTMENT_OTHER): Payer: Self-pay

## 2021-10-27 MED ORDER — AMPHETAMINE-DEXTROAMPHETAMINE 10 MG PO TABS
ORAL_TABLET | ORAL | 0 refills | Status: DC
  Filled 2022-01-28: qty 60, 30d supply, fill #0

## 2021-10-27 MED ORDER — AMPHETAMINE-DEXTROAMPHETAMINE 10 MG PO TABS
ORAL_TABLET | ORAL | 0 refills | Status: DC
  Filled 2021-12-29: qty 60, 30d supply, fill #0

## 2021-10-27 MED ORDER — AMPHETAMINE-DEXTROAMPHETAMINE 10 MG PO TABS
ORAL_TABLET | ORAL | 0 refills | Status: DC
  Filled 2021-10-27: qty 60, 30d supply, fill #0

## 2021-11-09 ENCOUNTER — Other Ambulatory Visit (HOSPITAL_BASED_OUTPATIENT_CLINIC_OR_DEPARTMENT_OTHER): Payer: Self-pay

## 2021-11-30 ENCOUNTER — Other Ambulatory Visit (HOSPITAL_BASED_OUTPATIENT_CLINIC_OR_DEPARTMENT_OTHER): Payer: Self-pay

## 2021-11-30 MED ORDER — AMPHETAMINE-DEXTROAMPHET ER 10 MG PO CP24
ORAL_CAPSULE | ORAL | 0 refills | Status: DC
  Filled 2021-11-30: qty 30, 30d supply, fill #0

## 2021-12-01 ENCOUNTER — Other Ambulatory Visit (HOSPITAL_BASED_OUTPATIENT_CLINIC_OR_DEPARTMENT_OTHER): Payer: Self-pay

## 2021-12-18 ENCOUNTER — Other Ambulatory Visit (HOSPITAL_BASED_OUTPATIENT_CLINIC_OR_DEPARTMENT_OTHER): Payer: Self-pay

## 2021-12-29 ENCOUNTER — Other Ambulatory Visit (HOSPITAL_BASED_OUTPATIENT_CLINIC_OR_DEPARTMENT_OTHER): Payer: Self-pay

## 2022-01-14 ENCOUNTER — Other Ambulatory Visit (HOSPITAL_BASED_OUTPATIENT_CLINIC_OR_DEPARTMENT_OTHER): Payer: Self-pay

## 2022-01-20 ENCOUNTER — Other Ambulatory Visit (HOSPITAL_BASED_OUTPATIENT_CLINIC_OR_DEPARTMENT_OTHER): Payer: Self-pay

## 2022-01-28 ENCOUNTER — Other Ambulatory Visit (HOSPITAL_BASED_OUTPATIENT_CLINIC_OR_DEPARTMENT_OTHER): Payer: Self-pay

## 2022-03-08 ENCOUNTER — Other Ambulatory Visit: Payer: Self-pay | Admitting: Cardiology

## 2022-03-08 ENCOUNTER — Other Ambulatory Visit (HOSPITAL_BASED_OUTPATIENT_CLINIC_OR_DEPARTMENT_OTHER): Payer: Self-pay

## 2022-03-08 MED ORDER — AMPHETAMINE-DEXTROAMPHETAMINE 10 MG PO TABS
ORAL_TABLET | ORAL | 0 refills | Status: DC
  Filled 2022-03-08: qty 60, 30d supply, fill #0

## 2022-03-09 ENCOUNTER — Other Ambulatory Visit (HOSPITAL_BASED_OUTPATIENT_CLINIC_OR_DEPARTMENT_OTHER): Payer: Self-pay

## 2022-03-09 MED ORDER — METOPROLOL SUCCINATE ER 100 MG PO TB24
ORAL_TABLET | ORAL | 1 refills | Status: DC
  Filled 2022-03-09: qty 30, 30d supply, fill #0
  Filled 2022-04-23: qty 30, 30d supply, fill #1

## 2022-03-25 ENCOUNTER — Other Ambulatory Visit (HOSPITAL_BASED_OUTPATIENT_CLINIC_OR_DEPARTMENT_OTHER): Payer: Self-pay

## 2022-04-09 ENCOUNTER — Other Ambulatory Visit (HOSPITAL_BASED_OUTPATIENT_CLINIC_OR_DEPARTMENT_OTHER): Payer: Self-pay

## 2022-04-12 ENCOUNTER — Other Ambulatory Visit (HOSPITAL_BASED_OUTPATIENT_CLINIC_OR_DEPARTMENT_OTHER): Payer: Self-pay

## 2022-04-12 MED ORDER — AMPHETAMINE-DEXTROAMPHETAMINE 10 MG PO TABS
ORAL_TABLET | ORAL | 0 refills | Status: DC
  Filled 2022-04-12: qty 60, 30d supply, fill #0

## 2022-04-23 ENCOUNTER — Other Ambulatory Visit (HOSPITAL_BASED_OUTPATIENT_CLINIC_OR_DEPARTMENT_OTHER): Payer: Self-pay

## 2022-05-14 ENCOUNTER — Other Ambulatory Visit (HOSPITAL_BASED_OUTPATIENT_CLINIC_OR_DEPARTMENT_OTHER): Payer: Self-pay

## 2022-05-17 ENCOUNTER — Other Ambulatory Visit (HOSPITAL_BASED_OUTPATIENT_CLINIC_OR_DEPARTMENT_OTHER): Payer: Self-pay

## 2022-05-17 MED ORDER — AMPHETAMINE-DEXTROAMPHETAMINE 10 MG PO TABS
ORAL_TABLET | ORAL | 0 refills | Status: DC
  Filled 2022-05-17: qty 60, 30d supply, fill #0

## 2022-05-20 ENCOUNTER — Other Ambulatory Visit: Payer: Self-pay | Admitting: Cardiology

## 2022-05-20 ENCOUNTER — Other Ambulatory Visit (HOSPITAL_BASED_OUTPATIENT_CLINIC_OR_DEPARTMENT_OTHER): Payer: Self-pay

## 2022-05-20 MED ORDER — METOPROLOL SUCCINATE ER 100 MG PO TB24
ORAL_TABLET | ORAL | 0 refills | Status: DC
  Filled 2022-05-20: qty 15, 15d supply, fill #0

## 2022-06-04 ENCOUNTER — Other Ambulatory Visit: Payer: Self-pay | Admitting: Cardiology

## 2022-06-04 ENCOUNTER — Other Ambulatory Visit (HOSPITAL_BASED_OUTPATIENT_CLINIC_OR_DEPARTMENT_OTHER): Payer: Self-pay

## 2022-06-04 MED ORDER — METOPROLOL SUCCINATE ER 100 MG PO TB24
ORAL_TABLET | ORAL | 0 refills | Status: DC
  Filled 2022-06-04: qty 15, 15d supply, fill #0

## 2022-06-14 ENCOUNTER — Other Ambulatory Visit (HOSPITAL_BASED_OUTPATIENT_CLINIC_OR_DEPARTMENT_OTHER): Payer: Self-pay

## 2022-06-14 MED ORDER — MUPIROCIN 2 % EX OINT
TOPICAL_OINTMENT | CUTANEOUS | 0 refills | Status: AC
  Filled 2022-06-14: qty 22, 30d supply, fill #0

## 2022-06-14 MED ORDER — SULFAMETHOXAZOLE-TRIMETHOPRIM 800-160 MG PO TABS
ORAL_TABLET | ORAL | 0 refills | Status: DC
  Filled 2022-06-14: qty 14, 7d supply, fill #0

## 2022-06-15 ENCOUNTER — Other Ambulatory Visit (HOSPITAL_BASED_OUTPATIENT_CLINIC_OR_DEPARTMENT_OTHER): Payer: Self-pay

## 2022-06-15 MED ORDER — AMPHETAMINE-DEXTROAMPHETAMINE 10 MG PO TABS
10.0000 mg | ORAL_TABLET | Freq: Two times a day (BID) | ORAL | 0 refills | Status: DC
  Filled 2022-06-15: qty 60, 30d supply, fill #0

## 2022-06-15 MED ORDER — AMPHETAMINE-DEXTROAMPHETAMINE 10 MG PO TABS
10.0000 mg | ORAL_TABLET | Freq: Two times a day (BID) | ORAL | 0 refills | Status: DC
  Filled 2022-08-16: qty 60, 30d supply, fill #0

## 2022-06-15 MED ORDER — AMPHETAMINE-DEXTROAMPHETAMINE 10 MG PO TABS
10.0000 mg | ORAL_TABLET | Freq: Two times a day (BID) | ORAL | 0 refills | Status: DC
  Filled 2022-07-15: qty 60, 30d supply, fill #0

## 2022-06-22 ENCOUNTER — Other Ambulatory Visit: Payer: Self-pay | Admitting: Cardiology

## 2022-06-22 ENCOUNTER — Other Ambulatory Visit (HOSPITAL_BASED_OUTPATIENT_CLINIC_OR_DEPARTMENT_OTHER): Payer: Self-pay

## 2022-06-22 MED ORDER — METOPROLOL SUCCINATE ER 100 MG PO TB24
100.0000 mg | ORAL_TABLET | Freq: Every day | ORAL | 0 refills | Status: DC
  Filled 2022-06-22: qty 30, 30d supply, fill #0

## 2022-07-14 ENCOUNTER — Encounter: Payer: Self-pay | Admitting: Cardiology

## 2022-07-14 ENCOUNTER — Ambulatory Visit: Payer: BC Managed Care – PPO | Attending: Cardiology | Admitting: Cardiology

## 2022-07-14 ENCOUNTER — Other Ambulatory Visit (HOSPITAL_BASED_OUTPATIENT_CLINIC_OR_DEPARTMENT_OTHER): Payer: Self-pay

## 2022-07-14 VITALS — BP 128/82 | HR 53 | Ht 79.0 in | Wt 233.0 lb

## 2022-07-14 DIAGNOSIS — I4729 Other ventricular tachycardia: Secondary | ICD-10-CM

## 2022-07-14 DIAGNOSIS — R55 Syncope and collapse: Secondary | ICD-10-CM | POA: Diagnosis not present

## 2022-07-14 DIAGNOSIS — I514 Myocarditis, unspecified: Secondary | ICD-10-CM

## 2022-07-14 MED ORDER — METOPROLOL SUCCINATE ER 100 MG PO TB24
100.0000 mg | ORAL_TABLET | Freq: Every day | ORAL | 3 refills | Status: DC
  Filled 2022-07-14: qty 30, 30d supply, fill #0
  Filled 2022-08-24: qty 30, 30d supply, fill #1
  Filled 2022-09-24: qty 30, 30d supply, fill #2
  Filled 2022-10-22: qty 30, 30d supply, fill #3
  Filled 2022-11-25: qty 30, 30d supply, fill #4
  Filled 2022-12-31: qty 30, 30d supply, fill #5
  Filled 2023-02-02: qty 30, 30d supply, fill #6
  Filled 2023-03-04: qty 30, 30d supply, fill #7
  Filled 2023-04-03: qty 30, 30d supply, fill #8
  Filled 2023-05-09: qty 30, 30d supply, fill #9
  Filled 2023-06-08: qty 30, 30d supply, fill #10
  Filled 2023-07-11: qty 30, 30d supply, fill #11

## 2022-07-14 NOTE — Progress Notes (Signed)
Cardiology Office Note:    Date:  07/14/2022   ID:  Keith Wright, DOB [birth year], MRN [id number]  PCP:  Bartholome Bill, MD  Cardiologist:  Berniece Salines, DO  Electrophysiologist:  None   Referring MD: Bartholome Bill, MD   Chief Complaint  Patient presents with   Follow-up   History of Present Illness:    Keith Wright is a 41 y.o. male with a  hx of syncope at age 89 when he was playing basketball and he passed out. During that time he was worked he had a stress test as well as an echocardiogram he was told he has LVH and he was educated that this could have been affects heart.   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 He said he felt sweaty and as he tried to keep himself together he passed out. He is not sure how long he was out for.  During that time he also reported that he has been experiencing intermittent chest discomfort as well the shortness of breath.    I saw the patient November 07, 2020 we discussed his ZIO monitor as well as his coronary CTA.  Given his NSVT I recommended he undergo a cardiac MRI with EP evaluation.  I saw the patient on he was doing well from a cardiovascular standpoint with plan for 1 year follow up.   He is here today with no complaitns.  Past Medical History:  Diagnosis Date   Elevated platelet count    History of chicken pox     Past Surgical History:  Procedure Laterality Date   WISDOM TOOTH EXTRACTION      Current Medications: Current Meds  Medication Sig   amphetamine-dextroamphetamine (ADDERALL XR) 10 MG 24 hr capsule Take 1 capsule (10 mg total) by mouth every morning for 30 days.   amphetamine-dextroamphetamine (ADDERALL) 10 MG tablet Take 1 tablet by mouth 2 (two) times daily.    amphetamine-dextroamphetamine (ADDERALL) 10 MG tablet TAKE 1 TABLET (10 MG TOTAL) BY MOUTH 2 TIMES DAILY FOR 30 DAYS.   amphetamine-dextroamphetamine (ADDERALL) 10 MG tablet TAKE 1 TABLET (10 MG TOTAL) BY MOUTH 2 TIMES DAILY FOR 30 DAYS.   amphetamine-dextroamphetamine (ADDERALL) 10 MG tablet Take 1 tablet (10 mg total) by mouth 2 times daily.   amphetamine-dextroamphetamine (ADDERALL) 10 MG tablet Take 1 tablet (10 mg total) by mouth daily.   amphetamine-dextroamphetamine (ADDERALL) 10 MG tablet Take 1 tablet (10 mg total) by mouth 2 times daily for 30 days.   amphetamine-dextroamphetamine (ADDERALL) 10 MG tablet Take 1 tablet (10 mg total) by mouth 2 times daily.   amphetamine-dextroamphetamine (ADDERALL) 10 MG tablet Take 1 tablet (10 mg total) by mouth 2 times daily for 30 days.   amphetamine-dextroamphetamine (ADDERALL) 10 MG tablet Take 1 tablet (10 mg total) by mouth 2 times daily for 30 days.   amphetamine-dextroamphetamine (ADDERALL) 10 MG tablet Take 1 tablet (10 mg total) by mouth 2 times daily for 30 days.   amphetamine-dextroamphetamine (ADDERALL) 10 MG tablet Take 1 tablet (10 mg total) by mouth 2 times daily for 30 days.   amphetamine-dextroamphetamine (ADDERALL) 10 MG tablet Take 1 tablet (10 mg total) by mouth 2 times daily.   [START ON 08/14/2022] amphetamine-dextroamphetamine (ADDERALL)  10 MG tablet Take 1 tablet (10 mg total) by mouth 2 (two) times daily.   [START ON 07/15/2022] amphetamine-dextroamphetamine (ADDERALL) 10 MG tablet Take 1 tablet (10 mg total) by mouth 2 (two) times daily.   amphetamine-dextroamphetamine (ADDERALL) 10 MG tablet Take 1 tablet (10 mg total) by mouth 2 (two) times daily.   clindamycin (CLEOCIN) 300 MG capsule Take 1 capsule (300 mg total) by mouth 2 (two) times daily.   mupirocin ointment (BACTROBAN) 2 % Apply topically 3 times daily.   mupirocin ointment (BACTROBAN) 2 % Apply to affected area three times daily   sulfamethoxazole-trimethoprim  (BACTRIM DS) 800-160 MG tablet Take 1 tablet by mouth 2 times daily.   sulfamethoxazole-trimethoprim (BACTRIM DS) 800-160 MG tablet Take 1 tablet by mouth 2 times daily for 7 days.   [DISCONTINUED] metoprolol succinate (TOPROL-XL) 100 MG 24 hr tablet Take 1 tablet (100 mg total) by mouth daily. PLEASE KEEP UPCOMING APPOINTMENT FOR FUTURE REFILLS     Allergies:   Patient has no known allergies.   Social History   Socioeconomic History   Marital status: Married    Spouse name: Not on file   Number of children: Not on file   Years of education: Not on file   Highest education level: Not on file  Occupational History   Occupation: Pharmacist  Tobacco Use   Smoking status: Never   Smokeless tobacco: Never  Substance and Sexual Activity   Alcohol use: Yes    Alcohol/week: 0.0 standard drinks of alcohol    Comment: social -- 1 per month   Drug use: No   Sexual activity: Yes    Partners: Female  Other Topics Concern   Not on file  Social History Narrative   Not on file   Social Determinants of Health   Financial Resource Strain: Not on file  Food Insecurity: Not on file  Transportation Needs: Not on file  Physical Activity: Not on file  Stress: Not on file  Social Connections: Not on file     Family History: The patient's family history includes Colon cancer (age of onset: 22) in his paternal grandmother; Healthy in his brother, daughter, father, and mother; Heart attack in his maternal grandfather.  ROS:   Review of Systems  Constitution: Negative for decreased appetite, fever and weight gain.  HENT: Negative for congestion, ear discharge, hoarse voice and sore throat.   Eyes: Negative for discharge, redness, vision loss in right eye and visual halos.  Cardiovascular: Negative for chest pain, dyspnea on exertion, leg swelling, orthopnea and palpitations.  Respiratory: Negative for cough, hemoptysis, shortness of breath and snoring.   Endocrine: Negative for heat  intolerance and polyphagia.  Hematologic/Lymphatic: Negative for bleeding problem. Does not bruise/bleed easily.  Skin: Negative for flushing, nail changes, rash and suspicious lesions.  Musculoskeletal: Negative for arthritis, joint pain, muscle cramps, myalgias, neck pain and stiffness.  Gastrointestinal: Negative for abdominal pain, bowel incontinence, diarrhea and excessive appetite.  Genitourinary: Negative for decreased libido, genital sores and incomplete emptying.  Neurological: Negative for brief paralysis, focal weakness, headaches and loss of balance.  Psychiatric/Behavioral: Negative for altered mental status, depression and suicidal ideas.  Allergic/Immunologic: Negative for HIV exposure and persistent infections.    EKGs/Labs/Other Studies Reviewed:    The following studies were reviewed today:   EKG: None today  TTE 10/31/20  Review of the above records today demonstrates:   1. Left ventricular ejection fraction, by estimation, is 60 to 65%. The  left ventricle has normal  function. The left ventricle has no regional  wall motion abnormalities. There is mild concentric left ventricular  hypertrophy. Left ventricular diastolic  parameters were normal.   2. Right ventricular systolic function is normal. The right ventricular  size is normal.   3. The mitral valve is normal in structure. No evidence of mitral valve  regurgitation. No evidence of mitral stenosis.   4. The aortic valve is normal in structure. Aortic valve regurgitation is  trivial. No aortic stenosis is present.   5. The inferior vena cava is normal in size with greater than 50%  respiratory variability, suggesting right atrial pressure of 3 mmHg.    Monitor 11/05/20 personally reviewed The patient wore the monitor for 13 days 19 hours hours starting starting October 11, 2020. Indication: Syncope   The minimum heart rate was 44 bpm, maximum heart rate was 186 bpm, and average heart rate was 89  bpm. Predominant underlying rhythm was Sinus Rhythm.   1 run of Ventricular Tachycardia occurred lasting 12 beats with a maximum rate of 171bpm (average 156 bpm).   Premature atrial complexes were rare, less than 1%. Premature Ventricular complexes were rare, less than 1%.   No pauses, No AV block, no supraventricular tachycardia and no atrial fibrillation present.   42 patient triggered events mostly associated with sinus tachycardia.   Conclusion: This study is remarkable for nonsustained ventricular tachycardia.   Coronary CTA 11/04/2020 1. Coronary calcium score of 0. This was 0 percentile for age and sex matched control.   2. Normal coronary origin with left dominance.   3. No evidence of CAD   Cardiac MRI 12/09/2020 1. Normal left ventricular size and function, LVEF 53%. Mild LVH. Mild hypokinesis of inferoapical segment.   2. Normal right ventricular size by indexed volume. RVEF 44%, suggests borderline reduced systolic function. Visually, RV function appears normal.   3. There is post contrast delayed myocardial enhancement in one segment of LV apex. The epicardium and midmyocardium of the inferoapex demonstrate delayed myocardial enhancement with no definite myocardial edema. Findings most consistent with focal Myocarditis.  Recent Labs: No results found for requested labs within last 365 days.  Recent Lipid Panel    Component Value Date/Time   CHOL 177 08/13/2015 0759   TRIG 55.0 08/13/2015 0759   HDL 46.90 08/13/2015 0759   CHOLHDL 4 08/13/2015 0759   VLDL 11.0 08/13/2015 0759   LDLCALC 119 (H) 08/13/2015 0759    Physical Exam:    VS:  BP 128/82 (BP Location: Left Arm, Patient Position: Sitting, Cuff Size: Normal)   Pulse (!) 53   Ht '6\' 7"'$  (2.007 m)   Wt 233 lb (105.7 kg)   BMI 26.25 kg/m     Wt Readings from Last 3 Encounters:  07/14/22 233 lb (105.7 kg)  01/05/21 225 lb (102.1 kg)  12/15/20 227 lb (103 kg)     GEN: Well nourished, well developed  in no acute distress HEENT: Normal NECK: No JVD; No carotid bruits LYMPHATICS: No lymphadenopathy CARDIAC: S1S2 noted,RRR, no murmurs, rubs, gallops RESPIRATORY:  Clear to auscultation without rales, wheezing or rhonchi  ABDOMEN: Soft, non-tender, non-distended, +bowel sounds, no guarding. EXTREMITIES: No edema, No cyanosis, no clubbing MUSCULOSKELETAL:  No deformity  SKIN: Warm and dry NEUROLOGIC:  Alert and oriented x 3, non-focal PSYCHIATRIC:  Normal affect, good insight  ASSESSMENT:    1. NSVT (nonsustained ventricular tachycardia) ([place])   2. Syncope and collapse   3. Myocarditis, unspecified chronicity, unspecified myocarditis type ([place])  PLAN:     Continue the patient on his current dose of his beta-blocker for his NSVT.  No episode of syncope since I last saw the patient.  The patient is in agreement with the above plan. The patient left the office in stable condition.  The patient will follow up in 1 year or as needed.   Medication Adjustments/Labs and Tests Ordered: Current medicines are reviewed at length with the patient today.  Concerns regarding medicines are outlined above.  Orders Placed This Encounter  Procedures   EKG 12-Lead   Meds ordered this encounter  Medications   metoprolol succinate (TOPROL-XL) 100 MG 24 hr tablet    Sig: Take 1 tablet (100 mg total) by mouth daily.    Dispense:  90 tablet    Refill:  3    Patient Instructions  Medication Instructions:  Your physician recommends that you continue on your current medications as directed. Please refer to the Current Medication list given to you today.  *If you need a refill on your cardiac medications before your next appointment, please call your pharmacy*   Lab Work: NONE If you have labs (blood work) drawn today and your tests are completely normal, you will receive your results only by: [place] (if you have MyChart) OR A paper copy in the mail If you have any lab test that is  abnormal or we need to change your treatment, we will call you to review the results.   Testing/Procedures: NONE   Follow-Up: At [facility], you and your health needs are our priority.  As part of our continuing mission to provide you with exceptional heart care, we have created designated Provider Care Teams.  These Care Teams include your primary Cardiologist (physician) and Advanced Practice Providers (APPs -  Physician Assistants and Nurse Practitioners) who all work together to provide you with the care you need, when you need it.  We recommend signing up for the patient portal called "MyChart".  Sign up information is provided on this After Visit Summary.  MyChart is used to connect with patients for Virtual Visits (Telemedicine).  Patients are able to view lab/test results, encounter notes, upcoming appointments, etc.  Non-urgent messages can be sent to your provider as well.   To learn more about what you can do with MyChart, go to [email or web address].    Your next appointment:   1 year(s)  The format for your next appointment:   In Person  Provider:   Berniece Salines, DO      Adopting a Healthy Lifestyle.  Know what a healthy weight is for you (roughly BMI <25) and aim to maintain this   Aim for 7+ servings of fruits and vegetables daily   65-80+ fluid ounces of water or unsweet tea for healthy kidneys   Limit to max 1 drink of alcohol per day; avoid smoking/tobacco   Limit animal fats in diet for cholesterol and heart health - choose grass fed whenever available   Avoid highly processed foods, and foods high in saturated/trans fats   Aim for low stress - take time to unwind and care for your mental health   Aim for 150 min of moderate intensity exercise weekly for heart health, and weights twice weekly for bone health   Aim for 7-9 hours of sleep daily   When it comes to diets, agreement about the perfect plan isnt easy to find, even among the  experts. Experts at the [employer] developed  an idea known as the Healthy Eating Plate. Just imagine a plate divided into logical, healthy portions.   The emphasis is on diet quality:   Load up on vegetables and fruits - one-half of your plate: Aim for color and variety, and remember that potatoes dont count.   Go for whole grains - one-quarter of your plate: Whole wheat, barley, wheat berries, quinoa, oats, brown rice, and foods made with them. If you want pasta, go with whole wheat pasta.   Protein power - one-quarter of your plate: Fish, chicken, beans, and nuts are all healthy, versatile protein sources. Limit red meat.   The diet, however, does go beyond the plate, offering a few other suggestions.   Use healthy plant oils, such as olive, canola, soy, corn, sunflower and peanut. Check the labels, and avoid partially hydrogenated oil, which have unhealthy trans fats.   If youre thirsty, drink water. Coffee and tea are good in moderation, but skip sugary drinks and limit milk and dairy products to one or two daily servings.   The type of carbohydrate in the diet is more important than the amount. Some sources of carbohydrates, such as vegetables, fruits, whole grains, and beans-are healthier than others.   Finally, stay active  Signed, Berniece Salines, DO  07/14/2022 8:35 PM    [place]

## 2022-07-14 NOTE — Patient Instructions (Signed)
Medication Instructions:  Your physician recommends that you continue on your current medications as directed. Please refer to the Current Medication list given to you today.  *If you need a refill on your cardiac medications before your next appointment, please call your pharmacy*   Lab Work: NONE If you have labs (blood work) drawn today and your tests are completely normal, you will receive your results only by: MyChart Message (if you have MyChart) OR A paper copy in the mail If you have any lab test that is abnormal or we need to change your treatment, we will call you to review the results.   Testing/Procedures: NONE   Follow-Up: At [facility], you and your health needs are our priority.  As part of our continuing mission to provide you with exceptional heart care, we have created designated Provider Care Teams.  These Care Teams include your primary Cardiologist (physician) and Advanced Practice Providers (APPs -  Physician Assistants and Nurse Practitioners) who all work together to provide you with the care you need, when you need it.  We recommend signing up for the patient portal called "MyChart".  Sign up information is provided on this After Visit Summary.  MyChart is used to connect with patients for Virtual Visits (Telemedicine).  Patients are able to view lab/test results, encounter notes, upcoming appointments, etc.  Non-urgent messages can be sent to your provider as well.   To learn more about what you can do with MyChart, go to [email or web address].    Your next appointment:   1 year(s)  The format for your next appointment:   In Person  Provider:   Kardie Tobb, DO   

## 2022-07-15 ENCOUNTER — Other Ambulatory Visit (HOSPITAL_BASED_OUTPATIENT_CLINIC_OR_DEPARTMENT_OTHER): Payer: Self-pay

## 2022-07-29 ENCOUNTER — Other Ambulatory Visit (HOSPITAL_BASED_OUTPATIENT_CLINIC_OR_DEPARTMENT_OTHER): Payer: Self-pay

## 2022-07-29 MED ORDER — GABAPENTIN 100 MG PO CAPS
100.0000 mg | ORAL_CAPSULE | Freq: Three times a day (TID) | ORAL | 0 refills | Status: DC
  Filled 2022-07-29: qty 90, 30d supply, fill #0

## 2022-07-29 MED ORDER — OMEPRAZOLE 40 MG PO CPDR
40.0000 mg | DELAYED_RELEASE_CAPSULE | Freq: Every day | ORAL | 0 refills | Status: DC
  Filled 2022-07-29: qty 30, 30d supply, fill #0

## 2022-08-16 ENCOUNTER — Other Ambulatory Visit (HOSPITAL_BASED_OUTPATIENT_CLINIC_OR_DEPARTMENT_OTHER): Payer: Self-pay

## 2022-08-24 ENCOUNTER — Other Ambulatory Visit (HOSPITAL_BASED_OUTPATIENT_CLINIC_OR_DEPARTMENT_OTHER): Payer: Self-pay

## 2022-08-25 ENCOUNTER — Other Ambulatory Visit (HOSPITAL_BASED_OUTPATIENT_CLINIC_OR_DEPARTMENT_OTHER): Payer: Self-pay

## 2022-09-01 ENCOUNTER — Other Ambulatory Visit (HOSPITAL_BASED_OUTPATIENT_CLINIC_OR_DEPARTMENT_OTHER): Payer: Self-pay

## 2022-09-01 MED ORDER — AMOXICILLIN-POT CLAVULANATE 875-125 MG PO TABS
875.0000 mg | ORAL_TABLET | Freq: Two times a day (BID) | ORAL | 0 refills | Status: DC
  Filled 2022-09-01: qty 14, 7d supply, fill #0

## 2022-09-15 ENCOUNTER — Other Ambulatory Visit (HOSPITAL_BASED_OUTPATIENT_CLINIC_OR_DEPARTMENT_OTHER): Payer: Self-pay

## 2022-09-15 MED ORDER — AMPHETAMINE-DEXTROAMPHETAMINE 10 MG PO TABS
10.0000 mg | ORAL_TABLET | Freq: Two times a day (BID) | ORAL | 0 refills | Status: DC
  Filled 2022-09-15: qty 60, 30d supply, fill #0

## 2022-09-24 ENCOUNTER — Other Ambulatory Visit (HOSPITAL_BASED_OUTPATIENT_CLINIC_OR_DEPARTMENT_OTHER): Payer: Self-pay

## 2022-10-15 ENCOUNTER — Other Ambulatory Visit (HOSPITAL_BASED_OUTPATIENT_CLINIC_OR_DEPARTMENT_OTHER): Payer: Self-pay

## 2022-10-15 MED ORDER — AMPHETAMINE-DEXTROAMPHETAMINE 10 MG PO TABS
10.0000 mg | ORAL_TABLET | Freq: Two times a day (BID) | ORAL | 0 refills | Status: DC
  Filled 2022-10-15: qty 60, 30d supply, fill #0

## 2022-10-18 ENCOUNTER — Other Ambulatory Visit (HOSPITAL_BASED_OUTPATIENT_CLINIC_OR_DEPARTMENT_OTHER): Payer: Self-pay

## 2022-10-29 ENCOUNTER — Other Ambulatory Visit (HOSPITAL_BASED_OUTPATIENT_CLINIC_OR_DEPARTMENT_OTHER): Payer: Self-pay

## 2022-10-29 MED ORDER — BACLOFEN 20 MG PO TABS
20.0000 mg | ORAL_TABLET | Freq: Three times a day (TID) | ORAL | 0 refills | Status: DC
  Filled 2022-10-29: qty 30, 10d supply, fill #0

## 2022-11-16 ENCOUNTER — Other Ambulatory Visit (HOSPITAL_BASED_OUTPATIENT_CLINIC_OR_DEPARTMENT_OTHER): Payer: Self-pay

## 2022-11-17 ENCOUNTER — Other Ambulatory Visit (HOSPITAL_BASED_OUTPATIENT_CLINIC_OR_DEPARTMENT_OTHER): Payer: Self-pay

## 2022-11-17 MED ORDER — AMPHETAMINE-DEXTROAMPHETAMINE 10 MG PO TABS
10.0000 mg | ORAL_TABLET | Freq: Two times a day (BID) | ORAL | 0 refills | Status: DC
  Filled 2022-11-17: qty 60, 30d supply, fill #0

## 2022-11-30 ENCOUNTER — Other Ambulatory Visit (HOSPITAL_BASED_OUTPATIENT_CLINIC_OR_DEPARTMENT_OTHER): Payer: Self-pay

## 2022-11-30 MED ORDER — AMOXICILLIN-POT CLAVULANATE 875-125 MG PO TABS
1.0000 | ORAL_TABLET | Freq: Two times a day (BID) | ORAL | 0 refills | Status: AC
  Filled 2022-11-30: qty 14, 7d supply, fill #0

## 2022-12-20 ENCOUNTER — Other Ambulatory Visit (HOSPITAL_BASED_OUTPATIENT_CLINIC_OR_DEPARTMENT_OTHER): Payer: Self-pay

## 2022-12-21 ENCOUNTER — Other Ambulatory Visit (HOSPITAL_BASED_OUTPATIENT_CLINIC_OR_DEPARTMENT_OTHER): Payer: Self-pay

## 2022-12-21 MED ORDER — AMPHETAMINE-DEXTROAMPHETAMINE 10 MG PO TABS
10.0000 mg | ORAL_TABLET | Freq: Two times a day (BID) | ORAL | 0 refills | Status: DC
  Filled 2022-12-21: qty 60, 30d supply, fill #0

## 2022-12-22 ENCOUNTER — Other Ambulatory Visit (HOSPITAL_BASED_OUTPATIENT_CLINIC_OR_DEPARTMENT_OTHER): Payer: Self-pay

## 2023-01-03 ENCOUNTER — Encounter: Payer: Self-pay | Admitting: *Deleted

## 2023-01-03 ENCOUNTER — Other Ambulatory Visit (HOSPITAL_BASED_OUTPATIENT_CLINIC_OR_DEPARTMENT_OTHER): Payer: Self-pay

## 2023-01-03 MED ORDER — AMPHETAMINE-DEXTROAMPHETAMINE 10 MG PO TABS
10.0000 mg | ORAL_TABLET | Freq: Two times a day (BID) | ORAL | 0 refills | Status: DC
  Filled 2023-02-28: qty 60, 30d supply, fill #0

## 2023-01-03 MED ORDER — AMPHETAMINE-DEXTROAMPHETAMINE 10 MG PO TABS
10.0000 mg | ORAL_TABLET | Freq: Two times a day (BID) | ORAL | 0 refills | Status: DC
  Filled 2023-04-03: qty 60, 30d supply, fill #0

## 2023-01-03 MED ORDER — AMPHETAMINE-DEXTROAMPHETAMINE 10 MG PO TABS
10.0000 mg | ORAL_TABLET | Freq: Two times a day (BID) | ORAL | 0 refills | Status: DC
  Filled 2023-01-24: qty 60, 30d supply, fill #0

## 2023-01-24 ENCOUNTER — Other Ambulatory Visit (HOSPITAL_BASED_OUTPATIENT_CLINIC_OR_DEPARTMENT_OTHER): Payer: Self-pay

## 2023-02-28 ENCOUNTER — Other Ambulatory Visit: Payer: Self-pay

## 2023-02-28 ENCOUNTER — Other Ambulatory Visit (HOSPITAL_BASED_OUTPATIENT_CLINIC_OR_DEPARTMENT_OTHER): Payer: Self-pay

## 2023-03-04 ENCOUNTER — Other Ambulatory Visit: Payer: Self-pay

## 2023-04-03 ENCOUNTER — Other Ambulatory Visit (HOSPITAL_BASED_OUTPATIENT_CLINIC_OR_DEPARTMENT_OTHER): Payer: Self-pay

## 2023-04-04 ENCOUNTER — Other Ambulatory Visit: Payer: Self-pay

## 2023-04-04 ENCOUNTER — Other Ambulatory Visit (HOSPITAL_BASED_OUTPATIENT_CLINIC_OR_DEPARTMENT_OTHER): Payer: Self-pay

## 2023-04-04 MED ORDER — AMPHETAMINE-DEXTROAMPHETAMINE 10 MG PO TABS
10.0000 mg | ORAL_TABLET | Freq: Two times a day (BID) | ORAL | 0 refills | Status: DC
  Filled 2023-04-04 – 2023-06-08 (×2): qty 60, 30d supply, fill #0

## 2023-04-04 MED ORDER — AMPHETAMINE-DEXTROAMPHETAMINE 10 MG PO TABS: 10.0000 mg | ORAL_TABLET | Freq: Two times a day (BID) | ORAL | 0 refills | Status: DC

## 2023-04-04 MED ORDER — AMPHETAMINE-DEXTROAMPHETAMINE 10 MG PO TABS
10.0000 mg | ORAL_TABLET | Freq: Two times a day (BID) | ORAL | 0 refills | Status: DC
  Filled 2023-05-09: qty 60, 30d supply, fill #0

## 2023-05-09 ENCOUNTER — Other Ambulatory Visit (HOSPITAL_BASED_OUTPATIENT_CLINIC_OR_DEPARTMENT_OTHER): Payer: Self-pay

## 2023-05-09 ENCOUNTER — Other Ambulatory Visit: Payer: Self-pay

## 2023-06-08 ENCOUNTER — Other Ambulatory Visit (HOSPITAL_BASED_OUTPATIENT_CLINIC_OR_DEPARTMENT_OTHER): Payer: Self-pay

## 2023-06-09 ENCOUNTER — Other Ambulatory Visit (HOSPITAL_BASED_OUTPATIENT_CLINIC_OR_DEPARTMENT_OTHER): Payer: Self-pay

## 2023-06-09 MED ORDER — AMPHETAMINE-DEXTROAMPHETAMINE 10 MG PO TABS
10.0000 mg | ORAL_TABLET | Freq: Two times a day (BID) | ORAL | 0 refills | Status: DC
  Filled 2023-06-09 – 2023-07-11 (×2): qty 60, 30d supply, fill #0

## 2023-06-27 ENCOUNTER — Other Ambulatory Visit (HOSPITAL_BASED_OUTPATIENT_CLINIC_OR_DEPARTMENT_OTHER): Payer: Self-pay

## 2023-06-27 MED ORDER — AMPHETAMINE-DEXTROAMPHETAMINE 10 MG PO TABS
10.0000 mg | ORAL_TABLET | Freq: Two times a day (BID) | ORAL | 0 refills | Status: DC
  Filled 2023-10-13: qty 60, 30d supply, fill #0

## 2023-06-27 MED ORDER — AMPHETAMINE-DEXTROAMPHETAMINE 10 MG PO TABS
10.0000 mg | ORAL_TABLET | Freq: Two times a day (BID) | ORAL | 0 refills | Status: DC
  Filled 2023-09-12: qty 60, 30d supply, fill #0

## 2023-06-27 MED ORDER — AMPHETAMINE-DEXTROAMPHETAMINE 10 MG PO TABS
10.0000 mg | ORAL_TABLET | Freq: Two times a day (BID) | ORAL | 0 refills | Status: DC
  Filled 2023-08-11: qty 60, 30d supply, fill #0

## 2023-07-11 ENCOUNTER — Other Ambulatory Visit (HOSPITAL_BASED_OUTPATIENT_CLINIC_OR_DEPARTMENT_OTHER): Payer: Self-pay

## 2023-08-07 ENCOUNTER — Other Ambulatory Visit: Payer: Self-pay | Admitting: Cardiology

## 2023-08-10 ENCOUNTER — Other Ambulatory Visit (HOSPITAL_BASED_OUTPATIENT_CLINIC_OR_DEPARTMENT_OTHER): Payer: Self-pay

## 2023-08-10 MED ORDER — METOPROLOL SUCCINATE ER 100 MG PO TB24
100.0000 mg | ORAL_TABLET | Freq: Every day | ORAL | 0 refills | Status: DC
  Filled 2023-08-10: qty 30, 30d supply, fill #0

## 2023-08-11 ENCOUNTER — Other Ambulatory Visit (HOSPITAL_BASED_OUTPATIENT_CLINIC_OR_DEPARTMENT_OTHER): Payer: Self-pay

## 2023-08-11 ENCOUNTER — Other Ambulatory Visit: Payer: Self-pay

## 2023-09-12 ENCOUNTER — Other Ambulatory Visit: Payer: Self-pay

## 2023-09-12 ENCOUNTER — Other Ambulatory Visit (HOSPITAL_BASED_OUTPATIENT_CLINIC_OR_DEPARTMENT_OTHER): Payer: Self-pay

## 2023-09-16 ENCOUNTER — Other Ambulatory Visit: Payer: Self-pay | Admitting: Cardiology

## 2023-09-16 ENCOUNTER — Other Ambulatory Visit (HOSPITAL_BASED_OUTPATIENT_CLINIC_OR_DEPARTMENT_OTHER): Payer: Self-pay

## 2023-09-16 MED ORDER — METOPROLOL SUCCINATE ER 100 MG PO TB24
100.0000 mg | ORAL_TABLET | Freq: Every day | ORAL | 0 refills | Status: DC
  Filled 2023-09-16: qty 15, 15d supply, fill #0

## 2023-10-07 ENCOUNTER — Other Ambulatory Visit (HOSPITAL_BASED_OUTPATIENT_CLINIC_OR_DEPARTMENT_OTHER): Payer: Self-pay

## 2023-10-07 ENCOUNTER — Other Ambulatory Visit: Payer: Self-pay | Admitting: Cardiology

## 2023-10-07 MED ORDER — METOPROLOL SUCCINATE ER 100 MG PO TB24
100.0000 mg | ORAL_TABLET | Freq: Every day | ORAL | 0 refills | Status: DC
  Filled 2023-10-07: qty 30, 30d supply, fill #0
  Filled 2023-11-10: qty 30, 30d supply, fill #1

## 2023-10-13 ENCOUNTER — Other Ambulatory Visit: Payer: Self-pay

## 2023-10-13 ENCOUNTER — Other Ambulatory Visit (HOSPITAL_BASED_OUTPATIENT_CLINIC_OR_DEPARTMENT_OTHER): Payer: Self-pay

## 2023-10-14 ENCOUNTER — Other Ambulatory Visit (HOSPITAL_BASED_OUTPATIENT_CLINIC_OR_DEPARTMENT_OTHER): Payer: Self-pay

## 2023-11-14 ENCOUNTER — Other Ambulatory Visit (HOSPITAL_BASED_OUTPATIENT_CLINIC_OR_DEPARTMENT_OTHER): Payer: Self-pay

## 2023-11-14 MED ORDER — AMPHETAMINE-DEXTROAMPHETAMINE 10 MG PO TABS
10.0000 mg | ORAL_TABLET | Freq: Two times a day (BID) | ORAL | 0 refills | Status: DC
  Filled 2023-11-14: qty 60, 30d supply, fill #0

## 2023-11-18 ENCOUNTER — Other Ambulatory Visit (HOSPITAL_BASED_OUTPATIENT_CLINIC_OR_DEPARTMENT_OTHER): Payer: Self-pay

## 2023-11-18 MED ORDER — AMOXICILLIN-POT CLAVULANATE 875-125 MG PO TABS
1.0000 | ORAL_TABLET | Freq: Two times a day (BID) | ORAL | 0 refills | Status: DC
  Filled 2023-11-18: qty 14, 7d supply, fill #0

## 2023-11-22 ENCOUNTER — Other Ambulatory Visit (HOSPITAL_BASED_OUTPATIENT_CLINIC_OR_DEPARTMENT_OTHER): Payer: Self-pay

## 2023-11-24 ENCOUNTER — Encounter: Payer: Self-pay | Admitting: Cardiology

## 2023-11-24 ENCOUNTER — Ambulatory Visit: Payer: BC Managed Care – PPO | Attending: Cardiology | Admitting: Cardiology

## 2023-11-24 ENCOUNTER — Other Ambulatory Visit (HOSPITAL_BASED_OUTPATIENT_CLINIC_OR_DEPARTMENT_OTHER): Payer: Self-pay

## 2023-11-24 VITALS — BP 110/80 | HR 67 | Ht >= 80 in | Wt 240.8 lb

## 2023-11-24 DIAGNOSIS — I4729 Other ventricular tachycardia: Secondary | ICD-10-CM

## 2023-11-24 MED ORDER — METOPROLOL SUCCINATE ER 100 MG PO TB24
100.0000 mg | ORAL_TABLET | Freq: Every day | ORAL | 3 refills | Status: AC
  Filled 2023-11-24 – 2023-12-05 (×2): qty 30, 30d supply, fill #0
  Filled 2024-01-16: qty 30, 30d supply, fill #1
  Filled 2024-02-17: qty 30, 30d supply, fill #2
  Filled 2024-03-21: qty 30, 30d supply, fill #3
  Filled 2024-04-20: qty 30, 30d supply, fill #4
  Filled 2024-05-23: qty 30, 30d supply, fill #5
  Filled 2024-06-22: qty 30, 30d supply, fill #6
  Filled 2024-07-23: qty 30, 30d supply, fill #7
  Filled 2024-08-24: qty 30, 30d supply, fill #8
  Filled 2024-09-26: qty 30, 30d supply, fill #9
  Filled 2024-10-25: qty 30, 30d supply, fill #10

## 2023-11-24 NOTE — Progress Notes (Signed)
 Cardiology Office Note:    Date:  11/24/2023   ID:  Keith Wright, DOB 11-13-1980, MRN [id number]  PCP:  Verlon Au, MD  Cardiologist:  Thomasene Ripple, DO  Electrophysiologist:  None   Referring MD: Verlon Au, MD   No chief complaint on file.  History of Present Illness:    Keith Wright is a 43 y.o. male with a  hx of syncope at age 84 when he was playing basketball and he passed out. During that time he was worked he had a stress test as well as an echocardiogram he was told he has LVH and he was educated that this could have been affects heart.   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 He said he felt sweaty and as he tried to keep himself together he passed out. He is not sure how long he was out for.  During that time he also reported that he has been experiencing intermittent chest discomfort as well the shortness of breath.    I saw the patient November 07, 2020 we discussed his ZIO monitor as well as his coronary CTA.  Given his NSVT I recommended he undergo a cardiac MRI with EP evaluation.  His last visit with me was in 2023 he has been doing well since that time.   He is here today with no complaitns.  Past Medical History:  Diagnosis Date   Elevated platelet count    History of chicken pox     Past Surgical History:  Procedure Laterality Date   WISDOM TOOTH EXTRACTION      Current Medications: Current Meds  Medication Sig   amphetamine-dextroamphetamine (ADDERALL) 10 MG tablet Take 1 tablet (10 mg total) by mouth 2 (two) times daily.   mupirocin ointment (BACTROBAN) 2 % Apply to affected area three times daily   [DISCONTINUED] amphetamine-dextroamphetamine (ADDERALL XR) 10 MG 24 hr capsule Take 1 capsule (10 mg total) by  mouth every morning for 30 days.   [DISCONTINUED] amphetamine-dextroamphetamine (ADDERALL) 10 MG tablet Take 1 tablet by mouth 2 (two) times daily.   [DISCONTINUED] amphetamine-dextroamphetamine (ADDERALL) 10 MG tablet Take 1 tablet (10 mg total) by mouth 2 times daily.   [DISCONTINUED] metoprolol succinate (TOPROL-XL) 100 MG 24 hr tablet Take 1 tablet (100 mg total) by mouth daily. Pt must schedule an overdue followup appt with Dr. Servando Salina in Cardiology for any more refills. [phone] 2nd attempt Patient has an Appointment For 11/24/2023     Allergies:   Patient has no known allergies.   Social History   Socioeconomic History   Marital status: Married    Spouse name: Not on file   Number of children: Not on file   Years of education: Not on file   Highest education level: Not on file  Occupational History   Occupation: Pharmacist  Tobacco Use   Smoking status: Never   Smokeless tobacco: Never  Substance and Sexual Activity   Alcohol use: Yes    Alcohol/week: 0.0 standard drinks of alcohol    Comment: social -- 1 per month   Drug use: No   Sexual activity: Yes    Partners: Female  Other Topics Concern   Not on file  Social History Narrative  Not on file   Social Drivers of Health   Financial Resource Strain: Not on file  Food Insecurity: Low Risk  (06/26/2023)   Received from [facility]   Hunger Vital Sign    Worried About Running Out of Food in the Last Year: Never true    Ran Out of Food in the Last Year: Never true  Transportation Needs: No Transportation Needs (06/26/2023)   Received from [facility]    In the past 12 months, has lack of reliable transportation kept you from medical appointments, meetings, work or from getting things needed for daily living? : No  Physical Activity: Not on file  Stress: Not on file  Social Connections: Not on file     Family History: The patient's family history includes Colon cancer (age of onset: 54) in his  paternal grandmother; Healthy in his brother, daughter, father, and mother; Heart attack in his maternal grandfather.  ROS:   Review of Systems  Constitution: Negative for decreased appetite, fever and weight gain.  HENT: Negative for congestion, ear discharge, hoarse voice and sore throat.   Eyes: Negative for discharge, redness, vision loss in right eye and visual halos.  Cardiovascular: Negative for chest pain, dyspnea on exertion, leg swelling, orthopnea and palpitations.  Respiratory: Negative for cough, hemoptysis, shortness of breath and snoring.   Endocrine: Negative for heat intolerance and polyphagia.  Hematologic/Lymphatic: Negative for bleeding problem. Does not bruise/bleed easily.  Skin: Negative for flushing, nail changes, rash and suspicious lesions.  Musculoskeletal: Negative for arthritis, joint pain, muscle cramps, myalgias, neck pain and stiffness.  Gastrointestinal: Negative for abdominal pain, bowel incontinence, diarrhea and excessive appetite.  Genitourinary: Negative for decreased libido, genital sores and incomplete emptying.  Neurological: Negative for brief paralysis, focal weakness, headaches and loss of balance.  Psychiatric/Behavioral: Negative for altered mental status, depression and suicidal ideas.  Allergic/Immunologic: Negative for HIV exposure and persistent infections.    EKGs/Labs/Other Studies Reviewed:    The following studies were reviewed today:   EKG: None today  TTE 10/31/20  Review of the above records today demonstrates:   1. Left ventricular ejection fraction, by estimation, is 60 to 65%. The  left ventricle has normal function. The left ventricle has no regional  wall motion abnormalities. There is mild concentric left ventricular  hypertrophy. Left ventricular diastolic  parameters were normal.   2. Right ventricular systolic function is normal. The right ventricular  size is normal.   3. The mitral valve is normal in structure. No  evidence of mitral valve  regurgitation. No evidence of mitral stenosis.   4. The aortic valve is normal in structure. Aortic valve regurgitation is  trivial. No aortic stenosis is present.   5. The inferior vena cava is normal in size with greater than 50%  respiratory variability, suggesting right atrial pressure of 3 mmHg.    Monitor 11/05/20 personally reviewed The patient wore the monitor for 13 days 19 hours hours starting starting October 11, 2020. Indication: Syncope   The minimum heart rate was 44 bpm, maximum heart rate was 186 bpm, and average heart rate was 89 bpm. Predominant underlying rhythm was Sinus Rhythm.   1 run of Ventricular Tachycardia occurred lasting 12 beats with a maximum rate of 171bpm (average 156 bpm).   Premature atrial complexes were rare, less than 1%. Premature Ventricular complexes were rare, less than 1%.   No pauses, No AV block, no supraventricular tachycardia and no atrial fibrillation present.  44 patient triggered events mostly associated with sinus tachycardia.   Conclusion: This study is remarkable for nonsustained ventricular tachycardia.   Coronary CTA 11/04/2020 1. Coronary calcium score of 0. This was 0 percentile for age and sex matched control.   2. Normal coronary origin with left dominance.   3. No evidence of CAD   Cardiac MRI 12/09/2020 1. Normal left ventricular size and function, LVEF 53%. Mild LVH. Mild hypokinesis of inferoapical segment.   2. Normal right ventricular size by indexed volume. RVEF 44%, suggests borderline reduced systolic function. Visually, RV function appears normal.   3. There is post contrast delayed myocardial enhancement in one segment of LV apex. The epicardium and midmyocardium of the inferoapex demonstrate delayed myocardial enhancement with no definite myocardial edema. Findings most consistent with focal Myocarditis.  Recent Labs: No results found for requested labs within last 365 days.   Recent Lipid Panel    Component Value Date/Time   CHOL 177 08/13/2015 0759   TRIG 55.0 08/13/2015 0759   HDL 46.90 08/13/2015 0759   CHOLHDL 4 08/13/2015 0759   VLDL 11.0 08/13/2015 0759   LDLCALC 119 (H) 08/13/2015 0759    Physical Exam:    VS:  BP 110/80 (BP Location: Right Arm, Patient Position: Sitting, Cuff Size: Normal)   Pulse 67   Ht 6\' 8"  (2.032 m)   Wt 240 lb 12.8 oz (109.2 kg)   SpO2 97%   BMI 26.45 kg/m     Wt Readings from Last 3 Encounters:  11/24/23 240 lb 12.8 oz (109.2 kg)  07/14/22 233 lb (105.7 kg)  01/05/21 225 lb (102.1 kg)     GEN: Well nourished, well developed in no acute distress HEENT: Normal NECK: No JVD; No carotid bruits LYMPHATICS: No lymphadenopathy CARDIAC: S1S2 noted,RRR, no murmurs, rubs, gallops RESPIRATORY:  Clear to auscultation without rales, wheezing or rhonchi  ABDOMEN: Soft, non-tender, non-distended, +bowel sounds, no guarding. EXTREMITIES: No edema, No cyanosis, no clubbing MUSCULOSKELETAL:  No deformity  SKIN: Warm and dry NEUROLOGIC:  Alert and oriented x 3, non-focal PSYCHIATRIC:  Normal affect, good insight  ASSESSMENT:    1. NSVT (nonsustained ventricular tachycardia) (HCC)     PLAN:     Continue the patient on his current dose of his beta-blocker for his NSVT.  Will send medication refills today.  No episode of syncope since I last saw the patient.  The patient is in agreement with the above plan. The patient left the office in stable condition.  The patient will follow up in 1 year or as needed.   Medication Adjustments/Labs and Tests Ordered: Current medicines are reviewed at length with the patient today.  Concerns regarding medicines are outlined above.  Orders Placed This Encounter  Procedures   EKG 12-Lead   Meds ordered this encounter  Medications   metoprolol succinate (TOPROL-XL) 100 MG 24 hr tablet    Sig: Take 1 tablet (100 mg total) by mouth daily.    Dispense:  90 tablet    Refill:  3     Patient Instructions  Medication Instructions:  Your physician recommends that you continue on your current medications as directed. Please refer to the Current Medication list given to you today.  *If you need a refill on your cardiac medications before your next appointment, please call your pharmacy*   Follow-Up: At [facility], you and your health needs are our priority.  As part of our continuing mission to provide you with exceptional heart care, we  have created designated Provider Care Teams.  These Care Teams include your primary Cardiologist (physician) and Advanced Practice Providers (APPs -  Physician Assistants and Nurse Practitioners) who all work together to provide you with the care you need, when you need it.  Your next appointment:   1 year(s)  Provider:   Thomasene Ripple, DO      Adopting a Healthy Lifestyle.  Know what a healthy weight is for you (roughly BMI <25) and aim to maintain this   Aim for 7+ servings of fruits and vegetables daily   65-80+ fluid ounces of water or unsweet tea for healthy kidneys   Limit to max 1 drink of alcohol per day; avoid smoking/tobacco   Limit animal fats in diet for cholesterol and heart health - choose grass fed whenever available   Avoid highly processed foods, and foods high in saturated/trans fats   Aim for low stress - take time to unwind and care for your mental health   Aim for 150 min of moderate intensity exercise weekly for heart health, and weights twice weekly for bone health   Aim for 7-9 hours of sleep daily   When it comes to diets, agreement about the perfect plan isnt easy to find, even among the experts. Experts at the [facility] [employer] developed an idea known as the Healthy Eating Plate. Just imagine a plate divided into logical, healthy portions.   The emphasis is on diet quality:   Load up on vegetables and fruits - one-half of your plate: Aim for color and variety, and  remember that potatoes dont count.   Go for whole grains - one-quarter of your plate: Whole wheat, barley, wheat berries, quinoa, oats, brown rice, and foods made with them. If you want pasta, go with whole wheat pasta.   Protein power - one-quarter of your plate: Fish, chicken, beans, and nuts are all healthy, versatile protein sources. Limit red meat.   The diet, however, does go beyond the plate, offering a few other suggestions.   Use healthy plant oils, such as olive, canola, soy, corn, sunflower and peanut. Check the labels, and avoid partially hydrogenated oil, which have unhealthy trans fats.   If youre thirsty, drink water. Coffee and tea are good in moderation, but skip sugary drinks and limit milk and dairy products to one or two daily servings.   The type of carbohydrate in the diet is more important than the amount. Some sources of carbohydrates, such as vegetables, fruits, whole grains, and beans-are healthier than others.   Finally, stay active  Signed, Thomasene Ripple, DO  11/24/2023 1:11 PM    [facility]

## 2023-11-24 NOTE — Patient Instructions (Signed)
 Medication Instructions:  Your physician recommends that you continue on your current medications as directed. Please refer to the Current Medication list given to you today.  *If you need a refill on your cardiac medications before your next appointment, please call your pharmacy*  Follow-Up: At [facility], you and your health needs are our priority.  As part of our continuing mission to provide you with exceptional heart care, we have created designated Provider Care Teams.  These Care Teams include your primary Cardiologist (physician) and Advanced Practice Providers (APPs -  Physician Assistants and Nurse Practitioners) who all work together to provide you with the care you need, when you need it.   Your next appointment:   1 year(s)  Provider:   Thomasene Ripple, DO

## 2023-12-05 ENCOUNTER — Other Ambulatory Visit (HOSPITAL_BASED_OUTPATIENT_CLINIC_OR_DEPARTMENT_OTHER): Payer: Self-pay

## 2023-12-13 ENCOUNTER — Other Ambulatory Visit (HOSPITAL_BASED_OUTPATIENT_CLINIC_OR_DEPARTMENT_OTHER): Payer: Self-pay

## 2023-12-14 ENCOUNTER — Other Ambulatory Visit (HOSPITAL_BASED_OUTPATIENT_CLINIC_OR_DEPARTMENT_OTHER): Payer: Self-pay

## 2023-12-14 MED ORDER — AMPHETAMINE-DEXTROAMPHETAMINE 10 MG PO TABS
10.0000 mg | ORAL_TABLET | Freq: Two times a day (BID) | ORAL | 0 refills | Status: AC
  Filled 2023-12-14: qty 60, 30d supply, fill #0

## 2023-12-26 ENCOUNTER — Other Ambulatory Visit (HOSPITAL_BASED_OUTPATIENT_CLINIC_OR_DEPARTMENT_OTHER): Payer: Self-pay

## 2023-12-26 MED ORDER — AMPHETAMINE-DEXTROAMPHETAMINE 10 MG PO TABS
10.0000 mg | ORAL_TABLET | Freq: Two times a day (BID) | ORAL | 0 refills | Status: AC
  Filled 2024-01-16: qty 60, 30d supply, fill #0

## 2023-12-26 MED ORDER — AMPHETAMINE-DEXTROAMPHETAMINE 10 MG PO TABS
10.0000 mg | ORAL_TABLET | Freq: Two times a day (BID) | ORAL | 0 refills | Status: AC
  Filled 2024-03-21: qty 60, 30d supply, fill #0

## 2023-12-26 MED ORDER — AMPHETAMINE-DEXTROAMPHETAMINE 10 MG PO TABS
10.0000 mg | ORAL_TABLET | Freq: Two times a day (BID) | ORAL | 0 refills | Status: DC
Start: 2024-02-11 — End: 2024-03-21
  Filled 2024-02-17: qty 60, 30d supply, fill #0

## 2024-01-16 ENCOUNTER — Other Ambulatory Visit (HOSPITAL_BASED_OUTPATIENT_CLINIC_OR_DEPARTMENT_OTHER): Payer: Self-pay

## 2024-02-17 ENCOUNTER — Other Ambulatory Visit (HOSPITAL_BASED_OUTPATIENT_CLINIC_OR_DEPARTMENT_OTHER): Payer: Self-pay

## 2024-02-17 ENCOUNTER — Other Ambulatory Visit: Payer: Self-pay

## 2024-03-21 ENCOUNTER — Other Ambulatory Visit (HOSPITAL_BASED_OUTPATIENT_CLINIC_OR_DEPARTMENT_OTHER): Payer: Self-pay

## 2024-03-21 ENCOUNTER — Other Ambulatory Visit: Payer: Self-pay

## 2024-03-21 MED ORDER — AMPHETAMINE-DEXTROAMPHETAMINE 10 MG PO TABS
10.0000 mg | ORAL_TABLET | Freq: Two times a day (BID) | ORAL | 0 refills | Status: AC
  Filled 2024-06-22: qty 60, 30d supply, fill #0

## 2024-03-21 MED ORDER — AMPHETAMINE-DEXTROAMPHETAMINE 10 MG PO TABS
10.0000 mg | ORAL_TABLET | Freq: Two times a day (BID) | ORAL | 0 refills | Status: AC
  Filled 2024-03-21 – 2024-05-23 (×2): qty 60, 30d supply, fill #0

## 2024-03-21 MED ORDER — AMPHETAMINE-DEXTROAMPHETAMINE 10 MG PO TABS
10.0000 mg | ORAL_TABLET | Freq: Two times a day (BID) | ORAL | 0 refills | Status: AC
  Filled 2024-04-20: qty 60, 30d supply, fill #0

## 2024-04-20 ENCOUNTER — Other Ambulatory Visit (HOSPITAL_BASED_OUTPATIENT_CLINIC_OR_DEPARTMENT_OTHER): Payer: Self-pay

## 2024-05-23 ENCOUNTER — Other Ambulatory Visit (HOSPITAL_BASED_OUTPATIENT_CLINIC_OR_DEPARTMENT_OTHER): Payer: Self-pay

## 2024-05-23 ENCOUNTER — Other Ambulatory Visit: Payer: Self-pay

## 2024-06-08 ENCOUNTER — Other Ambulatory Visit (HOSPITAL_BASED_OUTPATIENT_CLINIC_OR_DEPARTMENT_OTHER): Payer: Self-pay

## 2024-06-22 ENCOUNTER — Other Ambulatory Visit: Payer: Self-pay

## 2024-06-22 ENCOUNTER — Other Ambulatory Visit (HOSPITAL_BASED_OUTPATIENT_CLINIC_OR_DEPARTMENT_OTHER): Payer: Self-pay

## 2024-07-02 ENCOUNTER — Other Ambulatory Visit (HOSPITAL_BASED_OUTPATIENT_CLINIC_OR_DEPARTMENT_OTHER): Payer: Self-pay

## 2024-07-02 MED ORDER — AMPHETAMINE-DEXTROAMPHETAMINE 10 MG PO TABS
10.0000 mg | ORAL_TABLET | Freq: Two times a day (BID) | ORAL | 0 refills | Status: AC
  Filled 2024-07-23: qty 60, 30d supply, fill #0

## 2024-07-02 MED ORDER — AMPHETAMINE-DEXTROAMPHETAMINE 10 MG PO TABS
10.0000 mg | ORAL_TABLET | Freq: Two times a day (BID) | ORAL | 0 refills | Status: DC
  Filled 2024-09-26: qty 60, 30d supply, fill #0

## 2024-07-02 MED ORDER — AMPHETAMINE-DEXTROAMPHETAMINE 10 MG PO TABS
10.0000 mg | ORAL_TABLET | Freq: Two times a day (BID) | ORAL | 0 refills | Status: AC
  Filled 2024-08-24: qty 60, 30d supply, fill #0

## 2024-07-17 ENCOUNTER — Other Ambulatory Visit (HOSPITAL_BASED_OUTPATIENT_CLINIC_OR_DEPARTMENT_OTHER): Payer: Self-pay

## 2024-07-17 MED ORDER — DOXYCYCLINE HYCLATE 100 MG PO CAPS
100.0000 mg | ORAL_CAPSULE | Freq: Two times a day (BID) | ORAL | 0 refills | Status: AC
  Filled 2024-07-17: qty 20, 10d supply, fill #0

## 2024-07-23 ENCOUNTER — Other Ambulatory Visit (HOSPITAL_BASED_OUTPATIENT_CLINIC_OR_DEPARTMENT_OTHER): Payer: Self-pay

## 2024-08-24 ENCOUNTER — Other Ambulatory Visit (HOSPITAL_BASED_OUTPATIENT_CLINIC_OR_DEPARTMENT_OTHER): Payer: Self-pay

## 2024-08-24 ENCOUNTER — Other Ambulatory Visit: Payer: Self-pay

## 2024-09-26 ENCOUNTER — Other Ambulatory Visit (HOSPITAL_BASED_OUTPATIENT_CLINIC_OR_DEPARTMENT_OTHER): Payer: Self-pay

## 2024-10-23 ENCOUNTER — Other Ambulatory Visit (HOSPITAL_BASED_OUTPATIENT_CLINIC_OR_DEPARTMENT_OTHER): Payer: Self-pay

## 2024-10-24 ENCOUNTER — Other Ambulatory Visit (HOSPITAL_BASED_OUTPATIENT_CLINIC_OR_DEPARTMENT_OTHER): Payer: Self-pay

## 2024-10-24 MED ORDER — AMPHETAMINE-DEXTROAMPHETAMINE 10 MG PO TABS
10.0000 mg | ORAL_TABLET | Freq: Two times a day (BID) | ORAL | 0 refills | Status: AC
  Filled 2024-10-24: qty 60, 30d supply, fill #0

## 2024-10-24 MED ORDER — AMPHETAMINE-DEXTROAMPHETAMINE 10 MG PO TABS: 10.0000 mg | ORAL_TABLET | Freq: Two times a day (BID) | ORAL | 0 refills | Status: AC

## 2024-10-26 ENCOUNTER — Other Ambulatory Visit (HOSPITAL_BASED_OUTPATIENT_CLINIC_OR_DEPARTMENT_OTHER): Payer: Self-pay
